# Patient Record
Sex: Female | Born: 1935 | Race: White | Hispanic: No | Marital: Single | Smoking: Former smoker
Health system: Southern US, Community
[De-identification: ages and names within clinical notes are randomized; demographics above are authoritative.]

## PROBLEM LIST (undated history)

## (undated) DIAGNOSIS — F32A Depression, unspecified: Secondary | ICD-10-CM

## (undated) DIAGNOSIS — S72141A Displaced intertrochanteric fracture of right femur, initial encounter for closed fracture: Secondary | ICD-10-CM

## (undated) DIAGNOSIS — I10 Essential (primary) hypertension: Secondary | ICD-10-CM

## (undated) DIAGNOSIS — I679 Cerebrovascular disease, unspecified: Secondary | ICD-10-CM

## (undated) DIAGNOSIS — E0590 Thyrotoxicosis, unspecified without thyrotoxic crisis or storm: Secondary | ICD-10-CM

## (undated) DIAGNOSIS — I442 Atrioventricular block, complete: Secondary | ICD-10-CM

## (undated) DIAGNOSIS — I4729 Other ventricular tachycardia: Secondary | ICD-10-CM

## (undated) DIAGNOSIS — M1990 Unspecified osteoarthritis, unspecified site: Secondary | ICD-10-CM

## (undated) DIAGNOSIS — F329 Major depressive disorder, single episode, unspecified: Secondary | ICD-10-CM

## (undated) DIAGNOSIS — I472 Ventricular tachycardia: Secondary | ICD-10-CM

## (undated) DIAGNOSIS — I739 Peripheral vascular disease, unspecified: Secondary | ICD-10-CM

## (undated) DIAGNOSIS — H3530 Unspecified macular degeneration: Secondary | ICD-10-CM

## (undated) DIAGNOSIS — E785 Hyperlipidemia, unspecified: Secondary | ICD-10-CM

## (undated) HISTORY — PX: OTHER SURGICAL HISTORY: SHX169

---
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
## 2001-04-06 ENCOUNTER — Encounter: Payer: Self-pay | Admitting: Family Medicine

## 2001-04-06 ENCOUNTER — Ambulatory Visit (HOSPITAL_COMMUNITY): Admission: RE | Admit: 2001-04-06 | Discharge: 2001-04-06 | Payer: Self-pay | Admitting: Family Medicine

## 2001-04-13 ENCOUNTER — Encounter: Payer: Self-pay | Admitting: Family Medicine

## 2001-04-13 ENCOUNTER — Ambulatory Visit (HOSPITAL_COMMUNITY): Admission: RE | Admit: 2001-04-13 | Discharge: 2001-04-13 | Payer: Self-pay | Admitting: Family Medicine

## 2001-04-20 ENCOUNTER — Encounter (INDEPENDENT_AMBULATORY_CARE_PROVIDER_SITE_OTHER): Payer: Self-pay | Admitting: *Deleted

## 2001-04-20 ENCOUNTER — Ambulatory Visit (HOSPITAL_COMMUNITY): Admission: RE | Admit: 2001-04-20 | Discharge: 2001-04-20 | Payer: Self-pay | Admitting: Family Medicine

## 2001-04-20 ENCOUNTER — Encounter: Payer: Self-pay | Admitting: Family Medicine

## 2001-05-13 ENCOUNTER — Ambulatory Visit (HOSPITAL_COMMUNITY): Admission: RE | Admit: 2001-05-13 | Discharge: 2001-05-13 | Payer: Self-pay | Admitting: Family Medicine

## 2001-05-13 ENCOUNTER — Encounter (INDEPENDENT_AMBULATORY_CARE_PROVIDER_SITE_OTHER): Payer: Self-pay | Admitting: Specialist

## 2001-05-13 ENCOUNTER — Encounter: Payer: Self-pay | Admitting: Family Medicine

## 2001-06-15 ENCOUNTER — Encounter: Payer: Self-pay | Admitting: Family Medicine

## 2001-06-15 ENCOUNTER — Encounter: Admission: RE | Admit: 2001-06-15 | Discharge: 2001-06-15 | Payer: Self-pay | Admitting: Family Medicine

## 2008-08-31 ENCOUNTER — Encounter: Admission: RE | Admit: 2008-08-31 | Discharge: 2008-08-31 | Payer: Self-pay | Admitting: Family Medicine

## 2008-09-18 ENCOUNTER — Ambulatory Visit: Payer: Self-pay | Admitting: Vascular Surgery

## 2009-04-16 ENCOUNTER — Encounter: Admission: RE | Admit: 2009-04-16 | Discharge: 2009-04-16 | Payer: Self-pay | Admitting: Neurology

## 2010-07-20 ENCOUNTER — Encounter: Payer: Self-pay | Admitting: Family Medicine

## 2010-07-21 ENCOUNTER — Encounter: Payer: Self-pay | Admitting: Internal Medicine

## 2010-11-11 NOTE — H&P (Signed)
HISTORY AND PHYSICAL EXAMINATION   September 18, 2008   Re:  Kathryn [name], ACRE                  DOB:  Jun 14, 1923   DATE OF ADMISSION:  To be determined.   CHIEF COMPLAINT:  Severe left internal carotid stenosis - asymptomatic.   HISTORY OF PRESENT ILLNESS:  This 77 year old female was found by Dr.  Nicholos Johns to have a left carotid bruit.  She had a history of hypertension  but no history of previous stroke, definite hemispheric TIA, amaurosis  fugax, diplopia, blurred vision, or syncope.  Carotid duplex exam was  performed at [facility] on August 31, 2008, which revealed an  80% left internal carotid stenosis and a proximal 60-70% right internal  carotid stenosis.  She was referred for further evaluation and possible  surgery.   PAST MEDICAL HISTORY:  1. Hypertension.  2. Hyperlipidemia.  3. Negative for diabetes mellitus, coronary artery disease, COPD, or      stroke.  She does have a history of cardiac arrhythmias in the      past.   PREVIOUS SURGERIES:  1. Removal of a vocal cord nodule.  2. Appendectomy.   FAMILY HISTORY:  Positive for coronary artery disease.  Father died of a  myocardial infarction.  Positive for diabetes in a maternal aunt.  Negative for stroke.   SOCIAL HISTORY:  She is single and has 1 child, is retired.  She smokes  a pack of cigarettes per day and has done so for 60+ years.  She does  not use alcohol.   REVIEW OF SYSTEMS:  Negative for chest pain, dyspnea on exertion, PND,  orthopnea.  Has had some weight loss over the last 18 months due to  slight decrease in appetite.  No GI or GU symptoms.  Has lower extremity  discomfort while walking.  Significant arthritis.   ALLERGIES:  None known.   MEDICATIONS:  1. Lisinopril/hydrochlorothiazide 20/25 mg 1 daily.  2. Simvastatin 40 mg daily.  3. Sertraline hydrochloride 50 mg 1/2 tablet daily.   PHYSICAL EXAM:  Blood pressure 165/73, heart rate 75, respirations 14.  Generally, she is an elderly spry female who is in no apparent distress,  alert and oriented x3.  Neck is supple, 3+ carotid pulses palpable.  There is a high-pitched bruit over the left carotid bifurcation.  No  palpable adenopathy in the neck.  Chest is clear to auscultation.  Neurologic exam is normal.  Cardiovascular exam reveals a regular  rhythm, no murmurs.  Her abdomen is soft and nontender with no masses.  She has 3+ femoral, popliteal, and 1+ dorsalis pedis pulses bilaterally.  There is no distal edema.   I reviewed the velocities in the report of the carotid duplex, and agree  that she does have at least an 80% left internal carotid stenosis, which  is asymptomatic.   IMPRESSION:  Severe left internal carotid stenosis - asymptomatic.   PLAN:  I have recommended left carotid endarterectomy for this  asymptomatic stenosis.  Risks and benefits have been thoroughly  discussed with her and her grand daughters, and they will discuss this  and let us know when she would like to proceed.   Quita Skye Hart Rochester, M.D.  Electronically Signed   JDL/MEDQ  D:  09/18/2008  T:  09/19/2008  Job:  [id number]   cc:   Bryan Lemma. Manus Gunning, M.D.  Robert A. Nicholos Johns, M.D.

## 2011-09-22 DIAGNOSIS — S72141A Displaced intertrochanteric fracture of right femur, initial encounter for closed fracture: Secondary | ICD-10-CM

## 2011-09-22 HISTORY — DX: Displaced intertrochanteric fracture of right femur, initial encounter for closed fracture: S72.141A

## 2011-09-23 ENCOUNTER — Other Ambulatory Visit: Payer: Self-pay

## 2011-09-23 ENCOUNTER — Encounter (HOSPITAL_COMMUNITY): Payer: Self-pay | Admitting: Emergency Medicine

## 2011-09-23 ENCOUNTER — Emergency Department (HOSPITAL_COMMUNITY): Payer: Medicare Other

## 2011-09-23 ENCOUNTER — Inpatient Hospital Stay (HOSPITAL_COMMUNITY)
Admission: EM | Admit: 2011-09-23 | Discharge: 2011-09-28 | DRG: 481 | Disposition: A | Discharge status: Skilled Nursing Facility | Payer: Medicare Other | Attending: Internal Medicine | Admitting: Internal Medicine

## 2011-09-23 DIAGNOSIS — I379 Nonrheumatic pulmonary valve disorder, unspecified: Secondary | ICD-10-CM | POA: Diagnosis present

## 2011-09-23 DIAGNOSIS — Z720 Tobacco use: Secondary | ICD-10-CM | POA: Diagnosis present

## 2011-09-23 DIAGNOSIS — Y998 Other external cause status: Secondary | ICD-10-CM

## 2011-09-23 DIAGNOSIS — I442 Atrioventricular block, complete: Secondary | ICD-10-CM

## 2011-09-23 DIAGNOSIS — I079 Rheumatic tricuspid valve disease, unspecified: Secondary | ICD-10-CM | POA: Diagnosis present

## 2011-09-23 DIAGNOSIS — I10 Essential (primary) hypertension: Secondary | ICD-10-CM | POA: Diagnosis present

## 2011-09-23 DIAGNOSIS — H3530 Unspecified macular degeneration: Secondary | ICD-10-CM | POA: Diagnosis present

## 2011-09-23 DIAGNOSIS — S72009A Fracture of unspecified part of neck of unspecified femur, initial encounter for closed fracture: Secondary | ICD-10-CM

## 2011-09-23 DIAGNOSIS — I2789 Other specified pulmonary heart diseases: Secondary | ICD-10-CM | POA: Diagnosis present

## 2011-09-23 DIAGNOSIS — I498 Other specified cardiac arrhythmias: Secondary | ICD-10-CM | POA: Diagnosis present

## 2011-09-23 DIAGNOSIS — I472 Ventricular tachycardia: Secondary | ICD-10-CM

## 2011-09-23 DIAGNOSIS — Z8674 Personal history of sudden cardiac arrest: Secondary | ICD-10-CM

## 2011-09-23 DIAGNOSIS — S72141A Displaced intertrochanteric fracture of right femur, initial encounter for closed fracture: Secondary | ICD-10-CM | POA: Diagnosis present

## 2011-09-23 DIAGNOSIS — A498 Other bacterial infections of unspecified site: Secondary | ICD-10-CM | POA: Diagnosis present

## 2011-09-23 DIAGNOSIS — H548 Legal blindness, as defined in USA: Secondary | ICD-10-CM | POA: Diagnosis present

## 2011-09-23 DIAGNOSIS — I679 Cerebrovascular disease, unspecified: Secondary | ICD-10-CM

## 2011-09-23 DIAGNOSIS — Z7982 Long term (current) use of aspirin: Secondary | ICD-10-CM

## 2011-09-23 DIAGNOSIS — E785 Hyperlipidemia, unspecified: Secondary | ICD-10-CM | POA: Diagnosis present

## 2011-09-23 DIAGNOSIS — I459 Conduction disorder, unspecified: Secondary | ICD-10-CM | POA: Diagnosis present

## 2011-09-23 DIAGNOSIS — N390 Urinary tract infection, site not specified: Secondary | ICD-10-CM | POA: Diagnosis present

## 2011-09-23 DIAGNOSIS — S72143A Displaced intertrochanteric fracture of unspecified femur, initial encounter for closed fracture: Principal | ICD-10-CM | POA: Diagnosis present

## 2011-09-23 DIAGNOSIS — I739 Peripheral vascular disease, unspecified: Secondary | ICD-10-CM | POA: Insufficient documentation

## 2011-09-23 DIAGNOSIS — Z66 Do not resuscitate: Secondary | ICD-10-CM | POA: Diagnosis present

## 2011-09-23 DIAGNOSIS — Z87891 Personal history of nicotine dependence: Secondary | ICD-10-CM

## 2011-09-23 DIAGNOSIS — I6529 Occlusion and stenosis of unspecified carotid artery: Secondary | ICD-10-CM | POA: Diagnosis present

## 2011-09-23 DIAGNOSIS — E0590 Thyrotoxicosis, unspecified without thyrotoxic crisis or storm: Secondary | ICD-10-CM | POA: Diagnosis present

## 2011-09-23 DIAGNOSIS — W108XXA Fall (on) (from) other stairs and steps, initial encounter: Secondary | ICD-10-CM | POA: Diagnosis present

## 2011-09-23 HISTORY — DX: Unspecified osteoarthritis, unspecified site: M19.90

## 2011-09-23 HISTORY — DX: Major depressive disorder, single episode, unspecified: F32.9

## 2011-09-23 HISTORY — DX: Ventricular tachycardia: I47.2

## 2011-09-23 HISTORY — DX: Thyrotoxicosis, unspecified without thyrotoxic crisis or storm: E05.90

## 2011-09-23 HISTORY — DX: Peripheral vascular disease, unspecified: I73.9

## 2011-09-23 HISTORY — DX: Essential (primary) hypertension: I10

## 2011-09-23 HISTORY — DX: Other ventricular tachycardia: I47.29

## 2011-09-23 HISTORY — DX: Atrioventricular block, complete: I44.2

## 2011-09-23 HISTORY — DX: Displaced intertrochanteric fracture of right femur, initial encounter for closed fracture: S72.141A

## 2011-09-23 HISTORY — DX: Cerebrovascular disease, unspecified: I67.9

## 2011-09-23 HISTORY — DX: Unspecified macular degeneration: H35.30

## 2011-09-23 HISTORY — DX: Hyperlipidemia, unspecified: E78.5

## 2011-09-23 HISTORY — DX: Depression, unspecified: F32.A

## 2011-09-23 LAB — CBC
Hemoglobin: 11.8 g/dL — ABNORMAL LOW (ref 12.0–15.0)
MCHC: 34.7 g/dL (ref 30.0–36.0)
RBC: 3.79 MIL/uL — ABNORMAL LOW (ref 3.87–5.11)
WBC: 15.3 10*3/uL — ABNORMAL HIGH (ref 4.0–10.5)

## 2011-09-23 LAB — GLUCOSE, CAPILLARY: Glucose-Capillary: 143 mg/dL — ABNORMAL HIGH (ref 70–99)

## 2011-09-23 LAB — MRSA PCR SCREENING: MRSA by PCR: NEGATIVE

## 2011-09-23 LAB — URINALYSIS, ROUTINE W REFLEX MICROSCOPIC
Leukocytes, UA: NEGATIVE
Nitrite: NEGATIVE
Specific Gravity, Urine: 1.013 (ref 1.005–1.030)
pH: 6.5 (ref 5.0–8.0)

## 2011-09-23 LAB — DIFFERENTIAL
Basophils Relative: 0 % (ref 0–1)
Lymphocytes Relative: 3 % — ABNORMAL LOW (ref 12–46)
Monocytes Relative: 5 % (ref 3–12)
Neutro Abs: 14 10*3/uL — ABNORMAL HIGH (ref 1.7–7.7)
Neutrophils Relative %: 92 % — ABNORMAL HIGH (ref 43–77)

## 2011-09-23 LAB — BASIC METABOLIC PANEL
BUN: 16 mg/dL (ref 6–23)
Chloride: 95 mEq/L — ABNORMAL LOW (ref 96–112)
GFR calc Af Amer: >90 mL/min (ref >90)
Potassium: 4 mEq/L (ref 3.5–5.1)

## 2011-09-23 LAB — CARDIAC PANEL(CRET KIN+CKTOT+MB+TROPI)
CK, MB: 3.7 ng/mL (ref 0.3–4.0)
Troponin I: <0.3 ng/mL (ref <0.30)

## 2011-09-23 LAB — MAGNESIUM: Magnesium: 1.6 mg/dL (ref 1.5–2.5)

## 2011-09-23 LAB — TROPONIN I: Troponin I: <0.3 ng/mL (ref <0.30)

## 2011-09-23 MED ORDER — CALCIUM CARBONATE-VITAMIN D 500-200 MG-UNIT PO TABS
1.0000 | ORAL_TABLET | Freq: Every day | ORAL | Status: DC
  Administered 2011-09-23 – 2011-09-28 (×5): 1 via ORAL
  Filled 2011-09-23 (×6): qty 1

## 2011-09-23 MED ORDER — MORPHINE SULFATE 2 MG/ML IJ SOLN
2.0000 mg | Freq: Once | INTRAMUSCULAR | Status: AC
  Administered 2011-09-23: 2 mg via INTRAVENOUS
  Filled 2011-09-23: qty 1

## 2011-09-23 MED ORDER — PROSIGHT PO TABS
1.0000 | ORAL_TABLET | Freq: Every day | ORAL | Status: DC
  Administered 2011-09-23: 1 via ORAL
  Filled 2011-09-23 (×2): qty 1

## 2011-09-23 MED ORDER — OCUVITE PRESERVISION PO TABS: 1.0000 | ORAL_TABLET | Freq: Every day | ORAL | Status: DC

## 2011-09-23 MED ORDER — MORPHINE SULFATE 4 MG/ML IJ SOLN: 2.0000 mg | INTRAMUSCULAR | Status: DC | PRN

## 2011-09-23 MED ORDER — OMEGA-3 FATTY ACIDS 1000 MG PO CAPS: 1.0000 g | ORAL_CAPSULE | Freq: Every day | ORAL | Status: DC

## 2011-09-23 MED ORDER — OCUVITE-LUTEIN PO CAPS
1.0000 | ORAL_CAPSULE | Freq: Every day | ORAL | Status: DC
  Filled 2011-09-23: qty 1

## 2011-09-23 MED ORDER — OMEGA-3-ACID ETHYL ESTERS 1 G PO CAPS
1.0000 g | ORAL_CAPSULE | Freq: Two times a day (BID) | ORAL | Status: DC
  Administered 2011-09-23: 1 g via ORAL
  Filled 2011-09-23 (×3): qty 1

## 2011-09-23 MED ORDER — MORPHINE SULFATE 2 MG/ML IJ SOLN
1.0000 mg | INTRAMUSCULAR | Status: DC | PRN
  Administered 2011-09-24: 1 mg via INTRAVENOUS
  Filled 2011-09-23: qty 1

## 2011-09-23 MED ORDER — ASPIRIN EC 325 MG PO TBEC
325.0000 mg | DELAYED_RELEASE_TABLET | Freq: Every day | ORAL | Status: DC
  Administered 2011-09-24 – 2011-09-28 (×5): 325 mg via ORAL
  Filled 2011-09-23 (×6): qty 1

## 2011-09-23 MED ORDER — SODIUM CHLORIDE 0.9 % IV SOLN
INTRAVENOUS | Status: DC
  Administered 2011-09-23: 22:00:00 via INTRAVENOUS

## 2011-09-23 MED ORDER — MAGNESIUM SULFATE 40 MG/ML IJ SOLN
2.0000 g | Freq: Once | INTRAMUSCULAR | Status: AC
  Administered 2011-09-23: 2 g via INTRAVENOUS
  Filled 2011-09-23: qty 50

## 2011-09-23 MED ORDER — LISINOPRIL 40 MG PO TABS
40.0000 mg | ORAL_TABLET | Freq: Every day | ORAL | Status: DC
  Administered 2011-09-23 – 2011-09-28 (×6): 40 mg via ORAL
  Filled 2011-09-23 (×6): qty 1

## 2011-09-23 MED ORDER — CALCIUM-VITAMIN D 600-200 MG-UNIT PO TABS: 1.0000 | ORAL_TABLET | Freq: Every day | ORAL | Status: DC

## 2011-09-23 MED ORDER — ENOXAPARIN SODIUM 40 MG/0.4ML ~~LOC~~ SOLN
40.0000 mg | SUBCUTANEOUS | Status: DC
  Administered 2011-09-23: 40 mg via SUBCUTANEOUS
  Filled 2011-09-23 (×2): qty 0.4

## 2011-09-23 MED ORDER — CIPROFLOXACIN IN D5W 400 MG/200ML IV SOLN
400.0000 mg | INTRAVENOUS | Status: DC
  Administered 2011-09-24: 400 mg via INTRAVENOUS
  Filled 2011-09-23 (×2): qty 200

## 2011-09-23 MED ORDER — SERTRALINE HCL 50 MG PO TABS
50.0000 mg | ORAL_TABLET | Freq: Every day | ORAL | Status: DC
  Administered 2011-09-23 – 2011-09-28 (×6): 50 mg via ORAL
  Filled 2011-09-23 (×6): qty 1

## 2011-09-23 MED ORDER — AMIODARONE HCL IN DEXTROSE 360-4.14 MG/200ML-% IV SOLN
INTRAVENOUS | Status: AC
  Filled 2011-09-23: qty 200

## 2011-09-23 MED ORDER — HYDRALAZINE HCL 25 MG PO TABS
25.0000 mg | ORAL_TABLET | Freq: Three times a day (TID) | ORAL | Status: DC
  Administered 2011-09-23 – 2011-09-28 (×13): 25 mg via ORAL
  Filled 2011-09-23 (×17): qty 1

## 2011-09-23 MED ORDER — SIMVASTATIN 20 MG PO TABS
20.0000 mg | ORAL_TABLET | Freq: Every evening | ORAL | Status: DC
  Administered 2011-09-23 – 2011-09-28 (×6): 20 mg via ORAL
  Filled 2011-09-23 (×6): qty 1

## 2011-09-23 MED ORDER — METHIMAZOLE 5 MG PO TABS
5.0000 mg | ORAL_TABLET | Freq: Every day | ORAL | Status: DC
  Administered 2011-09-23 – 2011-09-28 (×6): 5 mg via ORAL
  Filled 2011-09-23 (×8): qty 1

## 2011-09-23 MED ORDER — MORPHINE SULFATE 4 MG/ML IJ SOLN: 4.0000 mg | Freq: Once | INTRAMUSCULAR | Status: DC

## 2011-09-23 MED ORDER — HYDROCODONE-ACETAMINOPHEN 5-325 MG PO TABS: 1.0000 | ORAL_TABLET | ORAL | Status: DC | PRN

## 2011-09-23 MED ORDER — VITAMIN B-6 100 MG PO TABS
100.0000 mg | ORAL_TABLET | Freq: Every day | ORAL | Status: DC
  Administered 2011-09-23 – 2011-09-28 (×5): 100 mg via ORAL
  Filled 2011-09-23 (×6): qty 1

## 2011-09-23 NOTE — ED Notes (Signed)
Assumed patient's care, pt is alert, awake, answers questions appropriately. Skin is warm and dry, respiration is even and unlabored. Daughter is at the bedside.

## 2011-09-23 NOTE — ED Notes (Signed)
Pt was found on floor by family this morning around 1015. Pt reports she lost her footing while walking up steps and fell onto steps last night. Pt was laying on floor since fall until found by family. Pt AAOx3.

## 2011-09-23 NOTE — Consult Note (Signed)
Reason for Consult:R hip pain  Referring Physician: EDP and hospitalist  HPI: Kathryn [name] is an 78 y.o. female found in her home by neighbor after falling last night and injuring right hip with inability to bear weight. She lay on floor overnight until neighbor noticed her light on overnight and checked on her this am and notified EMS.  As I entered room to evaluate pt she had just had episode of asystole and new EKG showing heart block 1st degree. Past Medical History  Diagnosis Date  . Hypertension   . High cholesterol   . Hyperthyroidism   . Arthritis   . Depression   . Broken hip 09/22/11    right; S/P fall  . Dysrhythmia     bradycardia    Past Surgical History  Procedure Date  . Appendectomy 1943  . Laryngeal nodule ~ 1960's    removed    History reviewed. No pertinent family history.  Social History:  reports that she quit smoking about 3 months ago. Her smoking use included Cigarettes. She has a 66 pack-year smoking history. She has never used smokeless tobacco. She reports that she does not drink alcohol or use illicit drugs.  Allergies: No Known Allergies  Medications: I have reviewed the patient's current medications.  Results for orders placed during the hospital encounter of 09/23/11 (from the past 48 hour(s))  URINALYSIS, ROUTINE W REFLEX MICROSCOPIC     Status: Abnormal   Collection Time   09/23/11 12:42 PM      Component Value Range Comment   Color, Urine YELLOW  YELLOW     APPearance CLOUDY (*) CLEAR     Specific Gravity, Urine 1.013  1.005 - 1.030     pH 6.5  5.0 - 8.0     Glucose, UA NEGATIVE  NEGATIVE (mg/dL)    Hgb urine dipstick MODERATE (*) NEGATIVE     Bilirubin Urine NEGATIVE  NEGATIVE     Ketones, ur NEGATIVE  NEGATIVE (mg/dL)    Protein, ur >782 (*) NEGATIVE (mg/dL)    Urobilinogen, UA 1.0  0.0 - 1.0 (mg/dL)    Nitrite NEGATIVE  NEGATIVE     Leukocytes, UA NEGATIVE  NEGATIVE    URINE MICROSCOPIC-ADD ON     Status: Abnormal   Collection Time   09/23/11 12:42 PM      Component Value Range Comment   Squamous Epithelial / LPF RARE  RARE     WBC, UA 0-2  <3 (WBC/hpf)    RBC / HPF 7-10  <3 (RBC/hpf)    Bacteria, UA MANY (*) RARE  CHECKED  CARDIAC PANEL(CRET KIN+CKTOT+MB+TROPI)     Status: Normal   Collection Time   09/23/11  1:00 PM      Component Value Range Comment   Total CK 64  7 - 177 (U/L)    CK, MB 3.7  0.3 - 4.0 (ng/mL)    Troponin I <0.30  <0.30 (ng/mL)    Relative Index RELATIVE INDEX IS INVALID  0.0 - 2.5    CBC     Status: Abnormal   Collection Time   09/23/11  1:10 PM      Component Value Range Comment   WBC 15.3 (*) 4.0 - 10.5 (K/uL)    RBC 3.79 (*) 3.87 - 5.11 (MIL/uL)    Hemoglobin 11.8 (*) 12.0 - 15.0 (g/dL)    HCT 95.6 (*) 21.3 - 46.0 (%)    MCV 89.7  78.0 - 100.0 (fL)    MCH 31.1  26.0 - 34.0 (pg)    MCHC 34.7  30.0 - 36.0 (g/dL)    RDW 16.1 (*) 09.6 - 15.5 (%)    Platelets 185  150 - 400 (K/uL)   DIFFERENTIAL     Status: Abnormal   Collection Time   09/23/11  1:10 PM      Component Value Range Comment   Neutrophils Relative 92 (*) 43 - 77 (%)    Neutro Abs 14.0 (*) 1.7 - 7.7 (K/uL)    Lymphocytes Relative 3 (*) 12 - 46 (%)    Lymphs Abs 0.5 (*) 0.7 - 4.0 (K/uL)    Monocytes Relative 5  3 - 12 (%)    Monocytes Absolute 0.8  0.1 - 1.0 (K/uL)    Eosinophils Relative 0  0 - 5 (%)    Eosinophils Absolute 0.0  0.0 - 0.7 (K/uL)    Basophils Relative 0  0 - 1 (%)    Basophils Absolute 0.0  0.0 - 0.1 (K/uL)   BASIC METABOLIC PANEL     Status: Abnormal   Collection Time   09/23/11  1:10 PM      Component Value Range Comment   Sodium 130 (*) 135 - 145 (mEq/L)    Potassium 4.0  3.5 - 5.1 (mEq/L)    Chloride 95 (*) 96 - 112 (mEq/L)    CO2 24  19 - 32 (mEq/L)    Glucose, Bld 188 (*) 70 - 99 (mg/dL)    BUN 16  6 - 23 (mg/dL)    Creatinine, Ser 0.45  0.50 - 1.10 (mg/dL)    Calcium 8.8  8.4 - 10.5 (mg/dL)    GFR calc non Af Amer 85 (*) >90 (mL/min)    GFR calc Af Amer >90  >90 (mL/min)      Dg Chest 1 View  09/23/2011  *RADIOLOGY REPORT*  Clinical Data: Right femoral neck fracture.  Preoperative respiratory evaluation.  CHEST - 1 VIEW 09/23/2011:  Comparison: None.  Findings: Cardiac silhouette moderately enlarged.  Prominent paracardiac fat pad on the left.  Thoracic aorta atherosclerotic. Hilar and mediastinal contours otherwise unremarkable.  Prominent bronchovascular markings diffusely and moderate central peribronchial thickening.  No localized airspace consolidation.  No evidence of interstitial pulmonary edema.  No visible pleural effusions.  Multiple left rib fractures, generalized osteopenia, and degenerative changes in the shoulder joints.  IMPRESSION: Moderate cardiomegaly without pulmonary edema.  Moderate changes of bronchitis and/or asthma, likely chronic.  No convincing evidence of acute cardiopulmonary disease.  Original Report Authenticated By: Arnell Sieving, M.D.   Dg Hip Complete Right  09/23/2011  *RADIOLOGY REPORT*  Clinical Data: Fall with right hip pain.  RIGHT HIP - COMPLETE 2+ VIEW  Comparison: None.  Findings: There is an intertrochanteric fracture of the right femur with varus angulation of the fracture fragments.  Mild degenerative changes in both hips.  No dislocation.  Osteopenia.  Degenerative changes in the visualized portion of the spine.  Vascular calcifications.  IMPRESSION:  1.  Intertrochanteric right femur fracture. Per CMS PQRS reporting requirements (PQRS Measure 24): Given the patient's age of greater than 50 and the fracture site (hip, distal radius, or spine), the patient should be tested for osteoporosis using DXA, and the appropriate treatment considered based on the DXA results. 2.  Osteopenia. 3.  Spondylosis.  Original Report Authenticated By: Reyes Ivan, M.D.   Ct Head Wo Contrast  09/23/2011  *RADIOLOGY REPORT*  Clinical Data:  Larey Seat.  Found down.  CT HEAD WITHOUT  CONTRAST CT CERVICAL SPINE WITHOUT CONTRAST  Technique:   Multidetector CT imaging of the head and cervical spine was performed following the standard protocol without intravenous contrast.  Multiplanar CT image reconstructions of the cervical spine were also generated.  Comparison:  None  CT HEAD  Findings: Age related cerebral atrophy, ventriculomegaly and periventricular white matter disease.  Remote appearing lacunar type infarct in the right basal ganglia region and mild ex vacuo dilatation of the frontal horn the right lateral ventricle.  No CT findings for acute hemispheric infarction and/or intracranial hemorrhage.  No extra-axial fluid collections are identified.  The brainstem and cerebellum grossly normal.  The bony structures are intact.  No skull fracture.  The paranasal sinuses and mastoid air cells are clear except for minimal debris in the right frontal sinus.  Osteoporotic changes involving the skull.  IMPRESSION:  1.  Age related cerebral atrophy, ventriculomegaly and periventricular white matter disease. 2.  Remote lacunar type basal ganglia infarcts. 3.  No skull fracture or subdural hematoma.  CT CERVICAL SPINE  Findings: Advanced degenerative cervical spondylosis with disc disease and facet disease.  The overall alignment is maintained. Mild multilevel degenerative subluxations.  The facets are normally aligned.  Advanced facet degenerative changes.  The skull base C1 and C1-2 articulations are maintained.  The dens is intact.  The lung apices are clear.  IMPRESSION:  1.  Advanced degenerative cervical spondylosis with disc disease and facet disease. 2.  No acute cervical spine fracture.  Original Report Authenticated By: P. Loralie Champagne, M.D.   Ct Cervical Spine Wo Contrast  09/23/2011  *RADIOLOGY REPORT*  Clinical Data:  Larey Seat.  Found down.  CT HEAD WITHOUT CONTRAST CT CERVICAL SPINE WITHOUT CONTRAST  Technique:  Multidetector CT imaging of the head and cervical spine was performed following the standard protocol without intravenous contrast.   Multiplanar CT image reconstructions of the cervical spine were also generated.  Comparison:  None  CT HEAD  Findings: Age related cerebral atrophy, ventriculomegaly and periventricular white matter disease.  Remote appearing lacunar type infarct in the right basal ganglia region and mild ex vacuo dilatation of the frontal horn the right lateral ventricle.  No CT findings for acute hemispheric infarction and/or intracranial hemorrhage.  No extra-axial fluid collections are identified.  The brainstem and cerebellum grossly normal.  The bony structures are intact.  No skull fracture.  The paranasal sinuses and mastoid air cells are clear except for minimal debris in the right frontal sinus.  Osteoporotic changes involving the skull.  IMPRESSION:  1.  Age related cerebral atrophy, ventriculomegaly and periventricular white matter disease. 2.  Remote lacunar type basal ganglia infarcts. 3.  No skull fracture or subdural hematoma.  CT CERVICAL SPINE  Findings: Advanced degenerative cervical spondylosis with disc disease and facet disease.  The overall alignment is maintained. Mild multilevel degenerative subluxations.  The facets are normally aligned.  Advanced facet degenerative changes.  The skull base C1 and C1-2 articulations are maintained.  The dens is intact.  The lung apices are clear.  IMPRESSION:  1.  Advanced degenerative cervical spondylosis with disc disease and facet disease. 2.  No acute cervical spine fracture.  Original Report Authenticated By: P. Loralie Champagne, M.D.    EAV:[id number] states she was in usual state of health.  Physical Exam: Extremities: Right LE shortened and ER. NVI Vitals Temp:  [97.5 F (36.4 C)-97.6 F (36.4 C)] 97.6 F (36.4 C) (03/27 1808) Pulse Rate:  [42-48] 45  (03/27 1808) Resp:  [  16-19] 17  (03/27 1808) BP: (182-235)/(52-57) 183/57 mmHg (03/27 1808) SpO2:  [97 %-100 %] 99 % (03/27 1808)  Assessment/Plan: Impression:Right Intertochanteric Hip fracture. New  DX 1st degree Heart block Plan:will need ORIF of Right hip however her current cardiac situation takes precedence.  [facility] for Dr. Francena Hanly 09/23/2011, 6:39 PM

## 2011-09-23 NOTE — Progress Notes (Signed)
Received report from ED nurse with patient code status as DNR.While reviewing admission health history, patient grand daughter informed me that the patient is supposed to be full code not DNR.Maren Reamer NP was made aware of the situation and also informed of patient blood pressure and HR.Recieved order to transfer patient to CCU.

## 2011-09-23 NOTE — ED Notes (Signed)
Spoke to Dr. Gonzella Lex and was informed about patient's condition, said that he will come and see the patient.

## 2011-09-23 NOTE — ED Notes (Addendum)
Pt came to the ED because she fell last night at home. She was found by family this morning. She was complaining of right hip pain. Per previous RN, pt also stop breathing and did not have a pulse earlier. Currently, breathing and in 3 degree heartblock. MD aware.

## 2011-09-23 NOTE — ED Notes (Signed)
Dr. Gonzella Lex was in to see the patient

## 2011-09-23 NOTE — Progress Notes (Signed)
Patient ID: Kathryn [name], female   DOB: 1922-07-10, 78 y.o.   MRN: [id number]   Pt was admitted earlier today with heart block and syncope spell resulting in right femur fx. She coded in the ED today. There was some descrepancy in her code status. Cardio verified FULL CODE status as did this NP via conversation with the grand daughter tonight. Pt wishes are FULL CODE but not to live on life support. Because of this, and after review of today's notes, pt is better suited for 2900 ICU status. This NP spoke to Dr. Herma Carson of PCCM who agrees with this decision. Pt will be transferred to 2900 tonight. Will remain under [facility] care with cardiology consulting for now. Should status change, can call PCCM back to consult or assume care.   Maren Reamer, NP [facility]

## 2011-09-23 NOTE — H&P (Addendum)
PCP: Tally Joe     DOA:  09/23/2011 11:52 AM  Chief Complaint:  Fall at home  HPI: 78 y/o female with hx of HTN, HL, hyperthyroidism, ex smoker, hx of left ICA stenosis brought in by EMS after sustaining a fall at home last evening. She is legally blind due to macular degeneration  but is able to ambulate in and around the house. She informs tripping on the stairs while going to her room. She denies any dizziness, headache, chest pain , palpitation or SOB associated with the fall . Denies N/V abdominal pain or diarrhea, denies chills  Or fever. She was unable to get up and was on the floor overnight and also was unable to hold her bowel and urine as she could not get up to use the bathroom. In the ED she was noted to have intertrochanteric fracture of rt femur. Patient at baseline is able to ambulate in and around the house,. She is able to see objects at close distance with her  glasses only.   Allergies: No Known Allergies  Prior to Admission medications   Medication Sig Start Date End Date Taking? Authorizing Provider  amLODipine (NORVASC) 2.5 MG tablet Take 2.5 mg by mouth daily.   Yes Historical Provider, MD  aspirin EC 325 MG tablet Take 325 mg by mouth daily.   Yes Historical Provider, MD  Calcium-Vitamin D 600-200 MG-UNIT per tablet Take 1 tablet by mouth daily.   Yes Historical Provider, MD  fish oil-omega-3 fatty acids 1000 MG capsule Take 1 g by mouth daily.   Yes Historical Provider, MD  lisinopril (PRINIVIL,ZESTRIL) 20 MG tablet Take 40 mg by mouth daily.   Yes Historical Provider, MD  methimazole (TAPAZOLE) 5 MG tablet Take 5 mg by mouth daily.   Yes Historical Provider, MD  Multiple Vitamins-Minerals (OCUVITE PRESERVISION PO) Take 1 tablet by mouth daily.   Yes Historical Provider, MD  pyridOXINE (VITAMIN B-6) 100 MG tablet Take 100 mg by mouth daily.   Yes Historical Provider, MD  sertraline (ZOLOFT) 50 MG tablet Take 50 mg by mouth daily.   Yes Historical Provider, MD    simvastatin (ZOCOR) 20 MG tablet Take 20 mg by mouth every evening.   Yes Historical Provider, MD    Past Medical History  Diagnosis Date  . Hypertension   . High cholesterol   . Hyperthyroidism   . Arthritis   . Depression   . Broken hip 09/22/11    right; S/P fall  . Dysrhythmia     bradycardia    Past Surgical History  Procedure Date  . Appendectomy 1943  . Laryngeal nodule ~ 1960's    removed    Social History:  reports that she quit smoking about 3 months ago. Her smoking use included Cigarettes. She has a 66 pack-year smoking history. She has never used smokeless tobacco. She reports that she does not drink alcohol or use illicit drugs.  History reviewed. No pertinent family history.  Review of Systems:  Constitutional: Denies fever, chills, diaphoresis, appetite change and fatigue.  HEENT: Denies photophobia, eye pain, redness, hearing loss, ear pain, congestion, sore throat, rhinorrhea, sneezing, mouth sores, trouble swallowing, neck pain, neck stiffness and tinnitus.   Respiratory: Denies SOB, DOE, cough, chest tightness,  and wheezing.   Cardiovascular: Denies chest pain, palpitations and leg swelling.  Gastrointestinal: Denies nausea, vomiting, abdominal pain, diarrhea, constipation, blood in stool and abdominal distention.  Genitourinary: Denies dysuria, urgency, frequency, hematuria, flank pain and difficulty urinating.  Musculoskeletal: severe pain over right hip, back pain, joint swelling, arthralgias and gait problem.  Skin: Denies pallor, rash and wound.  Neurological: Denies dizziness, seizures, syncope, weakness, light-headedness, numbness and headaches.  Hematological: Denies adenopathy. Easy bruising, personal or family bleeding history  Psychiatric/Behavioral: Denies suicidal ideation, mood changes, confusion, nervousness, sleep disturbance and agitation   Physical Exam:  Filed Vitals:   09/23/11 1153 09/23/11 1355 09/23/11 1641  BP: 235/55 197/54  182/54  Pulse: 42  47  Temp: 97.5 F (36.4 C)  97.6 F (36.4 C)  TempSrc: Oral  Oral  Resp: 18  19  SpO2: 97% 100% 100%    Constitutional: Vital signs reviewed.  Patient is an elderly female in no acute distress and cooperative with exam. Alert and oriented x3. Head: Normocephalic and atraumatic Ear: TM normal bilaterally Mouth: no erythema or exudates, MMM Eyes: PERRL, EOMI, conjunctivae normal, No scleral icterus.  Neck: Supple, Trachea midline normal ROM, No JVD, mass, thyromegaly, or carotid bruit present.  Cardiovascular: RRR, S1 normal, S2 normal, no MRG, pulses symmetric and intact bilaterally Pulmonary/Chest: CTAB, no wheezes, rales, or rhonchi Abdominal: Soft. Non-tender, non-distended, bowel sounds are normal, no masses, organomegaly, or guarding present.  GU: no CVA tenderness Musculoskeletal:rt leg externally rotated, painful on movement and ROM, No joint deformities, erythema, or stiffness,  Ext: no edema and no cyanosis, pulses palpable bilaterally (DP and PT) Hematology: no cervical, inginal, or axillary adenopathy.  Neurological: A&O x3, Strenght is normal and symmetric bilaterally, cranial nerve II-XII are grossly intact, no focal motor deficit, sensory intact to light touch bilaterally.  Skin: Warm, dry and intact. No rash, cyanosis, or clubbing.  Psychiatric: Normal mood and affect. speech and behavior is normal. Judgment and thought content normal. Cognition and memory are normal.   Labs on Admission:  Results for orders placed during the hospital encounter of 09/23/11 (from the past 48 hour(s))  URINALYSIS, ROUTINE W REFLEX MICROSCOPIC     Status: Abnormal   Collection Time   09/23/11 12:42 PM      Component Value Range Comment   Color, Urine YELLOW  YELLOW     APPearance CLOUDY (*) CLEAR     Specific Gravity, Urine 1.013  1.005 - 1.030     pH 6.5  5.0 - 8.0     Glucose, UA NEGATIVE  NEGATIVE (mg/dL)    Hgb urine dipstick MODERATE (*) NEGATIVE     Bilirubin  Urine NEGATIVE  NEGATIVE     Ketones, ur NEGATIVE  NEGATIVE (mg/dL)    Protein, ur >161 (*) NEGATIVE (mg/dL)    Urobilinogen, UA 1.0  0.0 - 1.0 (mg/dL)    Nitrite NEGATIVE  NEGATIVE     Leukocytes, UA NEGATIVE  NEGATIVE    URINE MICROSCOPIC-ADD ON     Status: Abnormal   Collection Time   09/23/11 12:42 PM      Component Value Range Comment   Squamous Epithelial / LPF RARE  RARE     WBC, UA 0-2  <3 (WBC/hpf)    RBC / HPF 7-10  <3 (RBC/hpf)    Bacteria, UA MANY (*) RARE  CHECKED  CARDIAC PANEL(CRET KIN+CKTOT+MB+TROPI)     Status: Normal   Collection Time   09/23/11  1:00 PM      Component Value Range Comment   Total CK 64  7 - 177 (U/L)    CK, MB 3.7  0.3 - 4.0 (ng/mL)    Troponin I <0.30  <0.30 (ng/mL)    Relative Index RELATIVE  INDEX IS INVALID  0.0 - 2.5    CBC     Status: Abnormal   Collection Time   09/23/11  1:10 PM      Component Value Range Comment   WBC 15.3 (*) 4.0 - 10.5 (K/uL)    RBC 3.79 (*) 3.87 - 5.11 (MIL/uL)    Hemoglobin 11.8 (*) 12.0 - 15.0 (g/dL)    HCT 40.9 (*) 81.1 - 46.0 (%)    MCV 89.7  78.0 - 100.0 (fL)    MCH 31.1  26.0 - 34.0 (pg)    MCHC 34.7  30.0 - 36.0 (g/dL)    RDW 91.4 (*) 78.2 - 15.5 (%)    Platelets 185  150 - 400 (K/uL)   DIFFERENTIAL     Status: Abnormal   Collection Time   09/23/11  1:10 PM      Component Value Range Comment   Neutrophils Relative 92 (*) 43 - 77 (%)    Neutro Abs 14.0 (*) 1.7 - 7.7 (K/uL)    Lymphocytes Relative 3 (*) 12 - 46 (%)    Lymphs Abs 0.5 (*) 0.7 - 4.0 (K/uL)    Monocytes Relative 5  3 - 12 (%)    Monocytes Absolute 0.8  0.1 - 1.0 (K/uL)    Eosinophils Relative 0  0 - 5 (%)    Eosinophils Absolute 0.0  0.0 - 0.7 (K/uL)    Basophils Relative 0  0 - 1 (%)    Basophils Absolute 0.0  0.0 - 0.1 (K/uL)   BASIC METABOLIC PANEL     Status: Abnormal   Collection Time   09/23/11  1:10 PM      Component Value Range Comment   Sodium 130 (*) 135 - 145 (mEq/L)    Potassium 4.0  3.5 - 5.1 (mEq/L)    Chloride 95 (*) 96 -  112 (mEq/L)    CO2 24  19 - 32 (mEq/L)    Glucose, Bld 188 (*) 70 - 99 (mg/dL)    BUN 16  6 - 23 (mg/dL)    Creatinine, Ser 9.56  0.50 - 1.10 (mg/dL)    Calcium 8.8  8.4 - 10.5 (mg/dL)    GFR calc non Af Amer 85 (*) >90 (mL/min)    GFR calc Af Amer >90  >90 (mL/min)     Radiological Exams on Admission: Comparison: None.  Findings: There is an intertrochanteric fracture of the right femur  with varus angulation of the fracture fragments. Mild degenerative  changes in both hips. No dislocation. Osteopenia. Degenerative  changes in the visualized portion of the spine. Vascular  calcifications.   IMPRESSION:  1. Intertrochanteric right femur fracture. Per CMS PQRS reporting  requirements (PQRS Measure 24): Given the patient's age of greater  than 50 and the fracture site (hip, distal radius, or spine), the  patient should be tested for osteoporosis using DXA, and the  appropriate treatment considered based on the DXA results.  2. Osteopenia.  3. Spondylosis.   Assessment/Plan   *Fracture, intertrochanteric, right femur Admit to medical floor on tele Pain control with prn vicodin and low dose morphine ( monitor for further  bradycardia) Dr Samul Dada informed from ED and will evaluate patient  NPO for now Foley in place    Bradycardia Patient sinus brady in mid 40s and low 50s.  Asymptomatic  does not now if she has hx of low HR Will hold amlod Check TSH  monitor on tele     Hypertension Add prn  hydralazine  cont ACEi   Dyslipidemia Cont statin   Tobacco use Quit since 3 months. Hx of 60 pack yr   Hyperthyroidism Cont methimazole  check TSH  Leucocytosis  UA showing many  bacteria  urine cx ordered  will treat with cipro  Hx of left ICA stenosis  progress note from 2010 mentions of patient have 80$ left ICA stenosis and referred for CEA but patient denies having any such procedure Cont ASA   DVT prophylaxis  sq lovenix  NPO for possible  surgery  Code status  patient wishes to be DNR  Plan discussed with patient and her granddaughter   Time Spent on Admission: 55 minutes  Tyde Lamison 09/23/2011, 5:08 PM  Patient was unresponsive and pulseless at around 6 pm and CPR was done at bedside in the ED for 10-15 secs. EKG done showed a 3rd degree AV block. patient responsive shortly and talking in her usual sense. She is now sinus brady to mid 40s on tele. discussed options for pacemaker and she wants to d/w cardiology.  Cardiology consult called for evaluation. Will admit to 2900 stepdown . D/w PCCM who recommends the same.

## 2011-09-23 NOTE — ED Notes (Signed)
RN made aware of Cardiologist consult and BP.  Will continue to monitor.

## 2011-09-23 NOTE — ED Provider Notes (Signed)
History     CSN: [id number]  Arrival date & time 09/23/11  1152   First MD Initiated Contact with Patient 09/23/11 1159      Chief Complaint  Patient presents with  . Fall  . Hip Pain    Right    (Consider location/radiation/quality/duration/timing/severity/associated sxs/prior treatment) HPI History provided by pt.   Pt lives by herself.  Slipped on the stairs yesterday evening at approx 10-10:30pm.  Was unable to get up from ground.  Her neighbor checked on her this morning because he noticed that the light had been on all night, and then called EMS when he found her on the floor, incontinent of both urine and stool.  Pt is unsure of whether or not she hit her head.  Denies LOC, headache, dizziness, vomiting, neck/back pain.  C/o pain in right hip only.  No recent illnesses including fever, cough, vomiting, diarrhea, chest pain, SOB or urinary sx.  No dizziness or lightheadedness prior to fall.   Past Medical History  Diagnosis Date  . Hypertension   . Thyroid disease     No past surgical history on file.  No family history on file.  History  Substance Use Topics  . Smoking status: Not on file  . Smokeless tobacco: Not on file  . Alcohol Use: Not on file    OB History    No data available      Review of Systems  All other systems reviewed and are negative.    Allergies  Review of patient's allergies indicates not on file.  Home Medications  No current outpatient prescriptions on file.  BP 235/55  Pulse 42  Temp(Src) 97.5 F (36.4 C) (Oral)  Resp 18  SpO2 97%  Physical Exam  Nursing note and vitals reviewed. Constitutional: She is oriented to person, place, and time. She appears well-developed and well-nourished. No distress.  HENT:  Head: Normocephalic and atraumatic.  Mouth/Throat: Oropharynx is clear and moist.  Eyes:       Normal appearance  Neck: Normal range of motion.  Cardiovascular: Normal rate and regular rhythm.   Pulmonary/Chest:  Effort normal and breath sounds normal.  Musculoskeletal:       RLE externally rotated at hip w/ shortening.  Pelvis stable. Tenderness of right hip.  Pelvis stable.  Entire spine non-tender.   Neurological: She is alert and oriented to person, place, and time.       Pt is blind but otherwise no testable CN 5/5 and equal upper and lower extremity strength.  No sensory deficits.  No past pointing.     Skin: Skin is warm and dry. No rash noted.  Psychiatric: She has a normal mood and affect. Her behavior is normal.    ED Course  Procedures (including critical care time)   Date: 09/23/2011  Rate: 45  Rhythm: atrial fibrillation  QRS Axis: normal  Intervals: normal  ST/T Wave abnormalities: normal  Conduction Disutrbances:right bundle branch block  Narrative Interpretation:   Old EKG Reviewed: none available    Labs Reviewed  CBC - Abnormal; Notable for the following:    WBC 15.3 (*)    RBC 3.79 (*)    Hemoglobin 11.8 (*)    HCT 34.0 (*)    RDW 16.0 (*)    All other components within normal limits  DIFFERENTIAL - Abnormal; Notable for the following:    Neutrophils Relative 92 (*)    Neutro Abs 14.0 (*)    Lymphocytes Relative 3 (*)  Lymphs Abs 0.5 (*)    All other components within normal limits  BASIC METABOLIC PANEL - Abnormal; Notable for the following:    Sodium 130 (*)    Chloride 95 (*)    Glucose, Bld 188 (*)    GFR calc non Af Amer 85 (*)    All other components within normal limits  URINALYSIS, ROUTINE W REFLEX MICROSCOPIC - Abnormal; Notable for the following:    APPearance CLOUDY (*)    Hgb urine dipstick MODERATE (*)    Protein, ur >300 (*)    All other components within normal limits  URINE MICROSCOPIC-ADD ON - Abnormal; Notable for the following:    Bacteria, UA MANY (*) CHECKED   All other components within normal limits  CARDIAC PANEL(CRET KIN+CKTOT+MB+TROPI)  URINE CULTURE   Dg Chest 1 View  09/23/2011  *RADIOLOGY REPORT*  Clinical Data: Right  femoral neck fracture.  Preoperative respiratory evaluation.  CHEST - 1 VIEW 09/23/2011:  Comparison: None.  Findings: Cardiac silhouette moderately enlarged.  Prominent paracardiac fat pad on the left.  Thoracic aorta atherosclerotic. Hilar and mediastinal contours otherwise unremarkable.  Prominent bronchovascular markings diffusely and moderate central peribronchial thickening.  No localized airspace consolidation.  No evidence of interstitial pulmonary edema.  No visible pleural effusions.  Multiple left rib fractures, generalized osteopenia, and degenerative changes in the shoulder joints.  IMPRESSION: Moderate cardiomegaly without pulmonary edema.  Moderate changes of bronchitis and/or asthma, likely chronic.  No convincing evidence of acute cardiopulmonary disease.  Original Report Authenticated By: Arnell Sieving, M.D.   Dg Hip Complete Right  09/23/2011  *RADIOLOGY REPORT*  Clinical Data: Fall with right hip pain.  RIGHT HIP - COMPLETE 2+ VIEW  Comparison: None.  Findings: There is an intertrochanteric fracture of the right femur with varus angulation of the fracture fragments.  Mild degenerative changes in both hips.  No dislocation.  Osteopenia.  Degenerative changes in the visualized portion of the spine.  Vascular calcifications.  IMPRESSION:  1.  Intertrochanteric right femur fracture. Per CMS PQRS reporting requirements (PQRS Measure 24): Given the patient's age of greater than 50 and the fracture site (hip, distal radius, or spine), the patient should be tested for osteoporosis using DXA, and the appropriate treatment considered based on the DXA results. 2.  Osteopenia. 3.  Spondylosis.  Original Report Authenticated By: Reyes Ivan, M.D.   Ct Head Wo Contrast  09/23/2011  *RADIOLOGY REPORT*  Clinical Data:  Larey Seat.  Found down.  CT HEAD WITHOUT CONTRAST CT CERVICAL SPINE WITHOUT CONTRAST  Technique:  Multidetector CT imaging of the head and cervical spine was performed following the  standard protocol without intravenous contrast.  Multiplanar CT image reconstructions of the cervical spine were also generated.  Comparison:  None  CT HEAD  Findings: Age related cerebral atrophy, ventriculomegaly and periventricular white matter disease.  Remote appearing lacunar type infarct in the right basal ganglia region and mild ex vacuo dilatation of the frontal horn the right lateral ventricle.  No CT findings for acute hemispheric infarction and/or intracranial hemorrhage.  No extra-axial fluid collections are identified.  The brainstem and cerebellum grossly normal.  The bony structures are intact.  No skull fracture.  The paranasal sinuses and mastoid air cells are clear except for minimal debris in the right frontal sinus.  Osteoporotic changes involving the skull.  IMPRESSION:  1.  Age related cerebral atrophy, ventriculomegaly and periventricular white matter disease. 2.  Remote lacunar type basal ganglia infarcts. 3.  No skull  fracture or subdural hematoma.  CT CERVICAL SPINE  Findings: Advanced degenerative cervical spondylosis with disc disease and facet disease.  The overall alignment is maintained. Mild multilevel degenerative subluxations.  The facets are normally aligned.  Advanced facet degenerative changes.  The skull base C1 and C1-2 articulations are maintained.  The dens is intact.  The lung apices are clear.  IMPRESSION:  1.  Advanced degenerative cervical spondylosis with disc disease and facet disease. 2.  No acute cervical spine fracture.  Original Report Authenticated By: P. Loralie Champagne, M.D.   Ct Cervical Spine Wo Contrast  09/23/2011  *RADIOLOGY REPORT*  Clinical Data:  Larey Seat.  Found down.  CT HEAD WITHOUT CONTRAST CT CERVICAL SPINE WITHOUT CONTRAST  Technique:  Multidetector CT imaging of the head and cervical spine was performed following the standard protocol without intravenous contrast.  Multiplanar CT image reconstructions of the cervical spine were also generated.   Comparison:  None  CT HEAD  Findings: Age related cerebral atrophy, ventriculomegaly and periventricular white matter disease.  Remote appearing lacunar type infarct in the right basal ganglia region and mild ex vacuo dilatation of the frontal horn the right lateral ventricle.  No CT findings for acute hemispheric infarction and/or intracranial hemorrhage.  No extra-axial fluid collections are identified.  The brainstem and cerebellum grossly normal.  The bony structures are intact.  No skull fracture.  The paranasal sinuses and mastoid air cells are clear except for minimal debris in the right frontal sinus.  Osteoporotic changes involving the skull.  IMPRESSION:  1.  Age related cerebral atrophy, ventriculomegaly and periventricular white matter disease. 2.  Remote lacunar type basal ganglia infarcts. 3.  No skull fracture or subdural hematoma.  CT CERVICAL SPINE  Findings: Advanced degenerative cervical spondylosis with disc disease and facet disease.  The overall alignment is maintained. Mild multilevel degenerative subluxations.  The facets are normally aligned.  Advanced facet degenerative changes.  The skull base C1 and C1-2 articulations are maintained.  The dens is intact.  The lung apices are clear.  IMPRESSION:  1.  Advanced degenerative cervical spondylosis with disc disease and facet disease. 2.  No acute cervical spine fracture.  Original Report Authenticated By: P. Loralie Champagne, M.D.     1. Hip fracture       MDM  [phone] F presents w/ c/o mechanical fall yesterday evening and subsequent R hip pain.  Unsure of whether or not she hit her head but is not anti-coagulated, no focal neuro deficits on exam, CT head and cervical spine neg.  Exam sig for HTN (pt has not taken her am meds today) and RLE shortening and external rotation.  Labs ordered d/t my concern that pt might have mild dementia and be a poor historian, and sig for mild hyponatremia and leukocytosis.  Urine sent for culture.  Xray  right hip shows intertrochanteric fx.  All results discussed w/ pt and her granddaughter.  [facility] consulted for admission.  Discussed w/ Dr. Rennis Chris as well.         Arie Sabina [place], [place] 09/23/11 [phone]

## 2011-09-23 NOTE — Consult Note (Signed)
CARDIOLOGY CONSULT NOTE  Patient ID: Kathryn [name], MRN: [id number], DOB/AGE: 07-19-1922 78 y.o. Admit date: 09/23/2011 Date of Consult: 09/23/2011  Primary Physician: Sissy Hoff, MD, MD Primary Cardiologist: New to [place]  Chief Complaint: Fall at home Reason for Consultation: Complete AV Block  HPI: 78 y.o. female w/ PMHx significant for legal blindness 2/2 macular degeneration, former tobacco abuse, HTN, HLD, Hyperthyroidism, Left ICA stenosis, and no known cardiac history who presented to [facility] on 09/23/2011 after falling at home and now found to have right intertrochanteric fx of femur and complete heart block.  She is clear that her foot slipped and that she did not lose consciousness.  No known cardiac history. She lives alone and reports slipping on stairs last night and was unable to get up from the ground. Her neighbor checked on her this morning and found her on the ground, incontinent of stool and urine. C/o right hip pain. Transported to the ED. She denies chest pain, palpitations, dizziness other presyncopal symptoms prior to fall. She denies loss of consciousness.  In the ED she was found to have an intertrochanteric fracture of the right femur. EKG showed complete AV block, 40s. Cardiac enzymes negative. Other labs significant for WBC 15.3, H&H 11.8/34, Na 130, Glucose 188, nitrite negative UA. CXR revealed moderate cardiomegaly w/o pulm edema, no acute cardiopulmonary changes. CT head was without acute intracranial findings. She subsequently became unresponsive with granddaughter at bedside and found to be in v.fib on the monitor. CPR was performed for 10-15secs with return of spontaneous circulation. She has had no further arrhythmias on telemetry and remains in complete heart block with rates in the 40s. She is stable and able to give a full history. She would like to be a full code, but does not want to be "kept alive artificially".   She denies  palpitations or syncope.  She occasionally experiences lightheadedness when she skips a meal.  Past Medical History  Diagnosis Date  . Hypertension   . Hyperlipemia   . Hyperthyroidism   . Arthritis   . Depression   . Hip fracture 09/22/11    right; S/P fall  . Non-sustained ventricular tachycardia   . Third degree AV block   . Cerebrovascular disease       Surgical History:  Past Surgical History  Procedure Date  . Appendectomy 1943  . Laryngeal nodule ~ 1960's    removed     Home Meds: Medication Sig  amLODipine (NORVASC) 2.5 MG tablet Take 2.5 mg by mouth daily.  aspirin EC 325 MG tablet Take 325 mg by mouth daily.  Calcium-Vitamin D 600-200 MG-UNIT per tablet Take 1 tablet by mouth daily.  fish oil-omega-3 fatty acids 1000 MG capsule Take 1 g by mouth daily.  lisinopril (PRINIVIL,ZESTRIL) 20 MG tablet Take 40 mg by mouth daily.  methimazole (TAPAZOLE) 5 MG tablet Take 5 mg by mouth daily.  Multiple Vitamins-Minerals (OCUVITE PRESERVISION PO) Take 1 tablet by mouth daily.  pyridOXINE (VITAMIN B-6) 100 MG tablet Take 100 mg by mouth daily.  sertraline (ZOLOFT) 50 MG tablet Take 50 mg by mouth daily.  simvastatin (ZOCOR) 20 MG tablet Take 20 mg by mouth every evening.    Inpatient Medications:    .  morphine injection  2 mg Intravenous Once  .  morphine injection  4 mg Intravenous Once  . DISCONTD: amiodarone (NEXTERONE PREMIX) 360 mg/200 mL dextrose       Allergies: No Known Allergies  History  Social History  . Marital Status: Single    Spouse Name: N/A    Number of Children: N/A  . Years of Education: N/A   Occupational History  . Not on file.   Social History Main Topics  . Smoking status: Former Smoker -- 1.0 packs/day for 66 years    Types: Cigarettes    Quit date: 06/21/2011  . Smokeless tobacco: Never Used  . Alcohol Use: No  . Drug Use: No  . Sexually Active: No   Other Topics Concern  . Not on file   Social History Narrative  . Lives  alone.  Widowed.    Family History: No known family cardiac history   Review of Systems: General: negative for chills, fever, night sweats or weight changes.  Cardiovascular: (+) ankle edema; negative for chest pain, shortness of breath, dyspnea on exertion, orthopnea, palpitations, or paroxysmal nocturnal dyspnea Dermatological: negative for rash Respiratory: negative for cough or wheezing Urologic: (+) urinary incontinence; negative for hematuria Abdominal: negative for nausea, vomiting, diarrhea, bright red blood per rectum, melena, or hematemesis Neurologic: negative for visual changes, syncope, or dizziness All other systems reviewed and are otherwise negative except as noted above.  Labs:  [facility] 09/23/11 1300  CKTOTAL 64  CKMB 3.7  TROPONINI <0.30   Lab Results  Component Value Date   WBC 15.3* 09/23/2011   HGB 11.8* 09/23/2011   HCT 34.0* 09/23/2011   MCV 89.7 09/23/2011   PLT 185 09/23/2011    Lab 09/23/11 1310  NA 130*  K 4.0  CL 95*  CO2 24  BUN 16  CREATININE 0.50  CALCIUM 8.8  PROT --  BILITOT --  ALKPHOS --  ALT --  AST --  GLUCOSE 188*    Radiology/Studies:  Dg Chest 1 View  09/23/2011  *RADIOLOGY REPORT*  Clinical Data: Right femoral neck fracture.  Preoperative respiratory evaluation.  CHEST - 1 VIEW 09/23/2011:  Comparison: None.  Findings: Cardiac silhouette moderately enlarged.  Prominent paracardiac fat pad on the left.  Thoracic aorta atherosclerotic. Hilar and mediastinal contours otherwise unremarkable.  Prominent bronchovascular markings diffusely and moderate central peribronchial thickening.  No localized airspace consolidation.  No evidence of interstitial pulmonary edema.  No visible pleural effusions.  Multiple left rib fractures, generalized osteopenia, and degenerative changes in the shoulder joints.  IMPRESSION: Moderate cardiomegaly without pulmonary edema.  Moderate changes of bronchitis and/or asthma, likely chronic.  No convincing  evidence of acute cardiopulmonary disease.  Original Report Authenticated By: Arnell Sieving, M.D.   Dg Hip Complete Right  09/23/2011  *RADIOLOGY REPORT*  Clinical Data: Fall with right hip pain.  RIGHT HIP - COMPLETE 2+ VIEW  Comparison: None.  Findings: There is an intertrochanteric fracture of the right femur with varus angulation of the fracture fragments.  Mild degenerative changes in both hips.  No dislocation.  Osteopenia.  Degenerative changes in the visualized portion of the spine.  Vascular calcifications.  IMPRESSION:  1.  Intertrochanteric right femur fracture. Per CMS PQRS reporting requirements (PQRS Measure 24): Given the patient's age of greater than 50 and the fracture site (hip, distal radius, or spine), the patient should be tested for osteoporosis using DXA, and the appropriate treatment considered based on the DXA results. 2.  Osteopenia. 3.  Spondylosis.  Original Report Authenticated By: Reyes Ivan, M.D.   Ct Head Wo Contrast  09/23/2011  *RADIOLOGY REPORT*  Clinical Data:  Larey Seat.  Found down.  CT HEAD WITHOUT CONTRAST CT CERVICAL SPINE WITHOUT CONTRAST  Technique:  Multidetector CT imaging of the head and cervical spine was performed following the standard protocol without intravenous contrast.  Multiplanar CT image reconstructions of the cervical spine were also generated.  Comparison:  None  CT HEAD  Findings: Age related cerebral atrophy, ventriculomegaly and periventricular white matter disease.  Remote appearing lacunar type infarct in the right basal ganglia region and mild ex vacuo dilatation of the frontal horn the right lateral ventricle.  No CT findings for acute hemispheric infarction and/or intracranial hemorrhage.  No extra-axial fluid collections are identified.  The brainstem and cerebellum grossly normal.  The bony structures are intact.  No skull fracture.  The paranasal sinuses and mastoid air cells are clear except for minimal debris in the right frontal  sinus.  Osteoporotic changes involving the skull.  IMPRESSION:  1.  Age related cerebral atrophy, ventriculomegaly and periventricular white matter disease. 2.  Remote lacunar type basal ganglia infarcts. 3.  No skull fracture or subdural hematoma.  CT CERVICAL SPINE  Findings: Advanced degenerative cervical spondylosis with disc disease and facet disease.  The overall alignment is maintained. Mild multilevel degenerative subluxations.  The facets are normally aligned.  Advanced facet degenerative changes.  The skull base C1 and C1-2 articulations are maintained.  The dens is intact.  The lung apices are clear.  IMPRESSION:  1.  Advanced degenerative cervical spondylosis with disc disease and facet disease. 2.  No acute cervical spine fracture.  Original Report Authenticated By: P. Loralie Champagne, M.D.   Ct Cervical Spine Wo Contrast  09/23/2011  *RADIOLOGY REPORT*  Clinical Data:  Larey Seat.  Found down.  CT HEAD WITHOUT CONTRAST CT CERVICAL SPINE WITHOUT CONTRAST  Technique:  Multidetector CT imaging of the head and cervical spine was performed following the standard protocol without intravenous contrast.  Multiplanar CT image reconstructions of the cervical spine were also generated.  Comparison:  None  CT HEAD  Findings: Age related cerebral atrophy, ventriculomegaly and periventricular white matter disease.  Remote appearing lacunar type infarct in the right basal ganglia region and mild ex vacuo dilatation of the frontal horn the right lateral ventricle.  No CT findings for acute hemispheric infarction and/or intracranial hemorrhage.  No extra-axial fluid collections are identified.  The brainstem and cerebellum grossly normal.  The bony structures are intact.  No skull fracture.  The paranasal sinuses and mastoid air cells are clear except for minimal debris in the right frontal sinus.  Osteoporotic changes involving the skull.  IMPRESSION:  1.  Age related cerebral atrophy, ventriculomegaly and periventricular  white matter disease. 2.  Remote lacunar type basal ganglia infarcts. 3.  No skull fracture or subdural hematoma.  CT CERVICAL SPINE  Findings: Advanced degenerative cervical spondylosis with disc disease and facet disease.  The overall alignment is maintained. Mild multilevel degenerative subluxations.  The facets are normally aligned.  Advanced facet degenerative changes.  The skull base C1 and C1-2 articulations are maintained.  The dens is intact.  The lung apices are clear.  IMPRESSION:  1.  Advanced degenerative cervical spondylosis with disc disease and facet disease. 2.  No acute cervical spine fracture.  Original Report Authenticated By: P. Loralie Champagne, M.D.    EKG:  09/23/11 @ 1359 - Complete AV Block 45bpm; long QTc; incomplete RBBB  09/23/11 @ 1829 - Complete AV Block 49bpm  Physical Exam: Blood pressure 183/57, pulse 45, temperature 97.6 F (36.4 C), temperature source Oral, resp. rate 17, SpO2 99.00%. General: Pleasant elderly white female in no acute  distress. Head: Normocephalic, atraumatic, sclera non-icteric, no xanthomas, nares are without discharge.  Neck: Supple. Harsh bilateral carotid bruits with normal upstrokes. JVD not elevated. Lungs: Clear bilaterally to auscultation without wheezes, rales, or rhonchi. Breathing is unlabored. Heart: Bradycardic, regular rhythm with S1 S2. 2-3/6 SEM R/LUSB radiated to neck; No rubs, or gallops appreciated. Abdomen: Soft, non-tender, non-distended with normoactive bowel sounds. No hepatomegaly. No rebound/guarding. No obvious abdominal masses. Msk:  Strength and tone appear normal for age. Extremities: No clubbing or cyanosis. Trace bilat ankle edema.  Pedal pulses are weak R>L; R leg shortened and externally rotated Neuro: Alert and oriented X 3. Moves all extremities spontaneously. Psych:  Responds to questions appropriately with a normal affect.   Assessment and Plan:  77 y.o. female w/ PMHx significant for legal blindness 2/2  macular degeneration, former tobacco abuse, HTN, HLD, Hyperthyroidism, Left ICA stenosis, and no known cardiac history who presented to [facility] on 09/23/2011 after falling at home and now found to have right intertrochanteric fx of femur and complete heart block.  1. Complete AV Block: She denies a history of prior cardiac disease or arrhythmias/heart block. States her normal heart rate is in the 60s. Has never seen a cardiologist and hasn't had an EKG in many years. She denies chest pain, sob, palpitations, dizziness, or syncope. She now presents after a fall and right hip frx and was found to be in complete heart block complicated by V. Fib arrest that appears to be torsades after reviewing telemetry. She regained consciousness with 10-15secs of CPR only and has had no further arrhythmias on telemetry. Will supplement Mg, keep Zoll pads in place, hold zoloft and CCB or any other AV nodal blockers, obtain echocardiogram, place in ICU, and plan for dual-chamber pacemaker tomorrow, or sooner if decompensates. She wishes to be a full code.  Signed, HOPE, JESSICA PA-C 09/23/2011, 6:59 PM  Cardiology Attending Patient interviewed and examined. Discussed with [facility], PA.  Above note annotated and modified based upon my findings.  Fairly feisty and independent octogenarian with good quality of life prior to current medical problems.  She has ASVD including cerebrovascular and peripheral vascular disease and certainly could have coronary disease as well.  Assuming that cardiac markers remain normal and LV function is normal by echocardiography, additional cardiac testing should not be necessary.  Limited tracings of arrhythmia that are available are suggestive of torsade initiated by a PVC.  She also has a long QT and is being treated with an antidepressant, which has been discontinued.  We have tentatively planned for dual chamber pacing tomorrow.  I anticipate that pacing alone will prevent  additional ventricular arrhythmias.  IV Mg will be given tonight.  Westbrook Bing, MD 09/23/2011, 8:35 PM

## 2011-09-23 NOTE — ED Notes (Addendum)
Dr. Juleen China at the bedside talking to the patient and the family about a possible pacemaker implant but the patient refused. The patient said that her doctor told her that she needed a pacemaker but refused it because she said that she knows people who did not do well with it. Dr. Juleen China is explaining to the patient the benefits of having one. Dr. Gonzella Lex in to see the patient.

## 2011-09-23 NOTE — ED Provider Notes (Signed)
Called to bedside by nursing because the patient became  unresponsive. Patient was alone in the room with her daughter when she just stopped talking and became apenic. Whe nursing arrived patient was unresponsive and pulseless. Chest compressions were performed for about 10-15 seconds with return of spontaneous circulation. No meds given. Patient now alert and at her baseline. Pads on. Review of rhythm strips showed an episode of what appears to be ventricular tachycardia. EKG afterward shows third degree heart block. Discussed case with Dr. Dietrich Pates, cardiology. Will evaluate patient in the emergency room.   Raeford Razor, MD 09/23/11 [phone]

## 2011-09-23 NOTE — ED Notes (Signed)
Pt noted to be responding,  Breathing on her own but noted to be on 3rd degree heart block. O2 started via NRM, vital signs checked. Ortho PA came to see the patient and is talking to the daughter.

## 2011-09-23 NOTE — ED Notes (Addendum)
Responded to daughter's call for help and found patient unresponsive and not breathing. Pt was noted to be on ventricular fibrillation on the monitor. CPR started, breathing assisted via BVM. Pacer pads attaches to the patient.

## 2011-09-23 NOTE — ED Provider Notes (Signed)
Medical screening examination/treatment/procedure(s) were performed by non-physician practitioner and as supervising physician I was immediately available for consultation/collaboration.   Laray Anger, DO 09/23/11 2012

## 2011-09-23 NOTE — ED Notes (Signed)
Pt was medicated for pain to the rt hip as ordered. Will continue to monitor

## 2011-09-24 ENCOUNTER — Inpatient Hospital Stay (HOSPITAL_COMMUNITY): Payer: Medicare Other | Admitting: Anesthesiology

## 2011-09-24 ENCOUNTER — Encounter (HOSPITAL_COMMUNITY): Payer: Self-pay | Admitting: Anesthesiology

## 2011-09-24 ENCOUNTER — Encounter (HOSPITAL_COMMUNITY)
Admission: EM | Disposition: A | Discharge status: Skilled Nursing Facility | Payer: Self-pay | Source: Home / Self Care | Attending: Internal Medicine

## 2011-09-24 ENCOUNTER — Inpatient Hospital Stay (HOSPITAL_COMMUNITY): Payer: Medicare Other

## 2011-09-24 ENCOUNTER — Other Ambulatory Visit: Payer: Self-pay

## 2011-09-24 DIAGNOSIS — I442 Atrioventricular block, complete: Secondary | ICD-10-CM

## 2011-09-24 DIAGNOSIS — I369 Nonrheumatic tricuspid valve disorder, unspecified: Secondary | ICD-10-CM

## 2011-09-24 HISTORY — PX: PERMANENT PACEMAKER INSERTION: SHX5480

## 2011-09-24 LAB — CARDIAC PANEL(CRET KIN+CKTOT+MB+TROPI): Relative Index: INVALID (ref 0.0–2.5)

## 2011-09-24 LAB — PROTIME-INR: Prothrombin Time: 16.1 seconds — ABNORMAL HIGH (ref 11.6–15.2)

## 2011-09-24 LAB — CBC
HCT: 29.4 % — ABNORMAL LOW (ref 36.0–46.0)
Hemoglobin: 10.3 g/dL — ABNORMAL LOW (ref 12.0–15.0)
MCV: 89.4 fL (ref 78.0–100.0)
WBC: 11.8 10*3/uL — ABNORMAL HIGH (ref 4.0–10.5)

## 2011-09-24 LAB — BASIC METABOLIC PANEL
BUN: 18 mg/dL (ref 6–23)
CO2: 25 mEq/L (ref 19–32)
Chloride: 98 mEq/L (ref 96–112)
GFR calc Af Amer: >90 mL/min (ref >90)
Potassium: 3.7 mEq/L (ref 3.5–5.1)

## 2011-09-24 LAB — APTT: aPTT: 47 seconds — ABNORMAL HIGH (ref 24–37)

## 2011-09-24 SURGERY — PERMANENT PACEMAKER INSERTION
Anesthesia: LOCAL

## 2011-09-24 SURGERY — PERMANENT PACEMAKER INSERTION
Anesthesia: Moderate Sedation | Laterality: Left

## 2011-09-24 SURGERY — FIXATION, FRACTURE, INTERTROCHANTERIC, WITH INTRAMEDULLARY ROD
Anesthesia: Choice | Site: Hip | Laterality: Right | Wound class: Clean

## 2011-09-24 MED ORDER — SODIUM CHLORIDE 0.9 % IR SOLN
80.0000 mg | Status: AC
  Administered 2011-09-24: 80 mg
  Filled 2011-09-24 (×3): qty 2

## 2011-09-24 MED ORDER — METOCLOPRAMIDE HCL 5 MG/ML IJ SOLN
5.0000 mg | Freq: Three times a day (TID) | INTRAMUSCULAR | Status: DC | PRN
  Filled 2011-09-24: qty 2

## 2011-09-24 MED ORDER — LIDOCAINE HCL (PF) 1 % IJ SOLN
INTRAMUSCULAR | Status: AC
  Filled 2011-09-24: qty 60

## 2011-09-24 MED ORDER — PROPOFOL 10 MG/ML IV EMUL
INTRAVENOUS | Status: DC | PRN
  Administered 2011-09-24: 30 mg via INTRAVENOUS
  Administered 2011-09-24: 130 mg via INTRAVENOUS

## 2011-09-24 MED ORDER — CHLORHEXIDINE GLUCONATE 4 % EX LIQD
Freq: Once | CUTANEOUS | Status: AC
  Administered 2011-09-24: 12:00:00 via TOPICAL

## 2011-09-24 MED ORDER — BIOTENE DRY MOUTH MT LIQD
15.0000 mL | Freq: Two times a day (BID) | OROMUCOSAL | Status: DC
  Administered 2011-09-24 – 2011-09-28 (×4): 15 mL via OROMUCOSAL

## 2011-09-24 MED ORDER — SODIUM CHLORIDE 0.9 % IV SOLN: INTRAVENOUS | Status: DC

## 2011-09-24 MED ORDER — ENOXAPARIN SODIUM 30 MG/0.3ML ~~LOC~~ SOLN
30.0000 mg | SUBCUTANEOUS | Status: DC
  Filled 2011-09-24: qty 0.3

## 2011-09-24 MED ORDER — DROPERIDOL 2.5 MG/ML IJ SOLN
0.6250 mg | INTRAMUSCULAR | Status: DC | PRN
  Filled 2011-09-24: qty 0.25

## 2011-09-24 MED ORDER — LACTATED RINGERS IV SOLN
INTRAVENOUS | Status: DC | PRN
  Administered 2011-09-24: 15:00:00 via INTRAVENOUS

## 2011-09-24 MED ORDER — MIDAZOLAM HCL 5 MG/5ML IJ SOLN
INTRAMUSCULAR | Status: AC
  Filled 2011-09-24: qty 5

## 2011-09-24 MED ORDER — HYDROCODONE-ACETAMINOPHEN 5-325 MG PO TABS: 1.0000 | ORAL_TABLET | ORAL | Status: DC | PRN

## 2011-09-24 MED ORDER — CEFAZOLIN SODIUM 1-5 GM-% IV SOLN
1.0000 g | INTRAVENOUS | Status: AC
  Administered 2011-09-24: 1 g via INTRAVENOUS
  Filled 2011-09-24 (×2): qty 50

## 2011-09-24 MED ORDER — NITROGLYCERIN IN D5W 200-5 MCG/ML-% IV SOLN
2.0000 ug/min | INTRAVENOUS | Status: DC
  Administered 2011-09-24: 10 ug/min via INTRAVENOUS

## 2011-09-24 MED ORDER — FENTANYL CITRATE 0.05 MG/ML IJ SOLN
INTRAMUSCULAR | Status: DC | PRN
  Administered 2011-09-24: 100 ug via INTRAVENOUS
  Administered 2011-09-24: 150 ug via INTRAVENOUS

## 2011-09-24 MED ORDER — MENTHOL 3 MG MT LOZG
1.0000 | LOZENGE | OROMUCOSAL | Status: DC | PRN
  Filled 2011-09-24: qty 9

## 2011-09-24 MED ORDER — ACETAMINOPHEN 325 MG PO TABS: 325.0000 mg | ORAL_TABLET | ORAL | Status: DC | PRN

## 2011-09-24 MED ORDER — SODIUM CHLORIDE 0.45 % IV SOLN: INTRAVENOUS | Status: DC

## 2011-09-24 MED ORDER — DEXTROSE 50 % IV SOLN
INTRAVENOUS | Status: AC
  Filled 2011-09-24: qty 50

## 2011-09-24 MED ORDER — HEPARIN (PORCINE) IN NACL 2-0.9 UNIT/ML-% IJ SOLN
INTRAMUSCULAR | Status: AC
  Filled 2011-09-24: qty 1000

## 2011-09-24 MED ORDER — ONDANSETRON HCL 4 MG/2ML IJ SOLN
INTRAMUSCULAR | Status: DC | PRN
  Administered 2011-09-24: 4 mg via INTRAVENOUS

## 2011-09-24 MED ORDER — NITROGLYCERIN IN D5W 200-5 MCG/ML-% IV SOLN
INTRAVENOUS | Status: AC
  Administered 2011-09-24: 10 ug/min via INTRAVENOUS
  Filled 2011-09-24: qty 250

## 2011-09-24 MED ORDER — LIDOCAINE HCL (CARDIAC) 20 MG/ML IV SOLN
INTRAVENOUS | Status: DC | PRN
  Administered 2011-09-24: 50 mg via INTRAVENOUS

## 2011-09-24 MED ORDER — METOCLOPRAMIDE HCL 5 MG PO TABS
5.0000 mg | ORAL_TABLET | Freq: Three times a day (TID) | ORAL | Status: DC | PRN
  Filled 2011-09-24: qty 2

## 2011-09-24 MED ORDER — CEFAZOLIN SODIUM 1-5 GM-% IV SOLN
1.0000 g | Freq: Four times a day (QID) | INTRAVENOUS | Status: AC
  Administered 2011-09-24 – 2011-09-25 (×3): 1 g via INTRAVENOUS
  Filled 2011-09-24 (×3): qty 50

## 2011-09-24 MED ORDER — ONDANSETRON HCL 4 MG/2ML IJ SOLN: 4.0000 mg | Freq: Four times a day (QID) | INTRAMUSCULAR | Status: DC | PRN

## 2011-09-24 MED ORDER — HYDROMORPHONE HCL PF 1 MG/ML IJ SOLN: 0.2500 mg | INTRAMUSCULAR | Status: DC | PRN

## 2011-09-24 MED ORDER — ENOXAPARIN SODIUM 30 MG/0.3ML ~~LOC~~ SOLN
30.0000 mg | SUBCUTANEOUS | Status: DC
  Administered 2011-09-25 – 2011-09-28 (×4): 30 mg via SUBCUTANEOUS
  Filled 2011-09-24 (×3): qty 0.3
  Filled 2011-09-24 (×2): qty 0.4

## 2011-09-24 MED ORDER — HYDRALAZINE HCL 20 MG/ML IJ SOLN
10.0000 mg | INTRAMUSCULAR | Status: DC | PRN
  Administered 2011-09-24 – 2011-09-26 (×3): 10 mg via INTRAVENOUS
  Filled 2011-09-24 (×2): qty 1
  Filled 2011-09-24: qty 0.5
  Filled 2011-09-24: qty 1

## 2011-09-24 MED ORDER — SUCCINYLCHOLINE CHLORIDE 20 MG/ML IJ SOLN
INTRAMUSCULAR | Status: DC | PRN
  Administered 2011-09-24: 100 mg via INTRAVENOUS

## 2011-09-24 MED ORDER — CHLORHEXIDINE GLUCONATE 0.12 % MT SOLN
15.0000 mL | Freq: Two times a day (BID) | OROMUCOSAL | Status: DC
Start: 2011-09-24 — End: 2011-09-24
  Administered 2011-09-24: 15 mL via OROMUCOSAL
  Filled 2011-09-24: qty 15

## 2011-09-24 MED ORDER — PHENOL 1.4 % MT LIQD
1.0000 | OROMUCOSAL | Status: DC | PRN
  Filled 2011-09-24: qty 177

## 2011-09-24 MED ORDER — ACETAMINOPHEN 650 MG RE SUPP: 650.0000 mg | Freq: Four times a day (QID) | RECTAL | Status: DC | PRN

## 2011-09-24 MED ORDER — CHLORHEXIDINE GLUCONATE 4 % EX LIQD
CUTANEOUS | Status: AC
  Filled 2011-09-24: qty 60

## 2011-09-24 MED ORDER — SODIUM CHLORIDE 0.9 % IV SOLN
INTRAVENOUS | Status: DC
  Administered 2011-09-24: 01:00:00 via INTRAVENOUS

## 2011-09-24 MED ORDER — CEFAZOLIN SODIUM 1-5 GM-% IV SOLN: 1.0000 g | Freq: Four times a day (QID) | INTRAVENOUS | Status: DC

## 2011-09-24 MED ORDER — ACETAMINOPHEN 325 MG PO TABS
650.0000 mg | ORAL_TABLET | Freq: Four times a day (QID) | ORAL | Status: DC | PRN
  Filled 2011-09-24: qty 2

## 2011-09-24 MED ORDER — ONDANSETRON HCL 4 MG PO TABS: 4.0000 mg | ORAL_TABLET | Freq: Four times a day (QID) | ORAL | Status: DC | PRN

## 2011-09-24 MED ORDER — 0.9 % SODIUM CHLORIDE (POUR BTL) OPTIME
TOPICAL | Status: DC | PRN
  Administered 2011-09-24: 1000 mL

## 2011-09-24 MED ORDER — FENTANYL CITRATE 0.05 MG/ML IJ SOLN
INTRAMUSCULAR | Status: AC
  Filled 2011-09-24: qty 2

## 2011-09-24 SURGICAL SUPPLY — 42 items
BANDAGE CONFORM 3  STR LF (GAUZE/BANDAGES/DRESSINGS) ×4 IMPLANT
BIT DRILL CANN LG 4.3MM (BIT) ×1 IMPLANT
BLADE SURG 15 STRL LF DISP TIS (BLADE) ×1 IMPLANT
BLADE SURG 15 STRL SS (BLADE) ×1
CLOTH BEACON ORANGE TIMEOUT ST (SAFETY) ×2 IMPLANT
DRAPE STERI IOBAN 125X83 (DRAPES) ×2 IMPLANT
DRILL BIT CANN LG 4.3MM (BIT) ×2
DRSG ADAPTIC 3X8 NADH LF (GAUZE/BANDAGES/DRESSINGS) ×2 IMPLANT
DRSG MEPILEX BORDER 4X4 (GAUZE/BANDAGES/DRESSINGS) ×4 IMPLANT
DRSG MEPILEX BORDER 4X8 (GAUZE/BANDAGES/DRESSINGS) ×2 IMPLANT
ELECT REM PT RETURN 9FT ADLT (ELECTROSURGICAL) ×2
ELECTRODE REM PT RTRN 9FT ADLT (ELECTROSURGICAL) ×1 IMPLANT
GLOVE BIO SURGEON STRL SZ7 (GLOVE) ×6 IMPLANT
GLOVE BIO SURGEON STRL SZ7.5 (GLOVE) ×2 IMPLANT
GLOVE BIO SURGEON STRL SZ8 (GLOVE) ×2 IMPLANT
GLOVE BIOGEL PI IND STRL 7.5 (GLOVE) ×2 IMPLANT
GLOVE BIOGEL PI INDICATOR 7.5 (GLOVE) ×2
GLOVE EUDERMIC 7 POWDERFREE (GLOVE) ×2 IMPLANT
GLOVE SS BIOGEL STRL SZ 7.5 (GLOVE) ×1 IMPLANT
GLOVE SUPERSENSE BIOGEL SZ 7.5 (GLOVE) ×1
GOWN STRL NON-REIN LRG LVL3 (GOWN DISPOSABLE) ×2 IMPLANT
GOWN STRL REIN XL XLG (GOWN DISPOSABLE) ×4 IMPLANT
GUIDEWIRE BALL NOSE 80CM (WIRE) ×2 IMPLANT
KIT BASIN OR (CUSTOM PROCEDURE TRAY) ×2 IMPLANT
KIT ROOM TURNOVER OR (KITS) ×2 IMPLANT
MANIFOLD NEPTUNE II (INSTRUMENTS) ×2 IMPLANT
NAIL HIP FRACT 130D 11X180 (Screw) ×2 IMPLANT
NS IRRIG 1000ML POUR BTL (IV SOLUTION) ×2 IMPLANT
PACK GENERAL/GYN (CUSTOM PROCEDURE TRAY) ×2 IMPLANT
PAD ARMBOARD 7.5X6 YLW CONV (MISCELLANEOUS) ×4 IMPLANT
SCREW BONE CORTICAL 5.0X3 (Screw) ×2 IMPLANT
SCREW LAG HIP NAIL 10.5X95 (Screw) ×2 IMPLANT
SPONGE LAP 18X18 X RAY DECT (DISPOSABLE) ×2 IMPLANT
STAPLER VISISTAT 35W (STAPLE) ×4 IMPLANT
STRIP CLOSURE SKIN 1/2X4 (GAUZE/BANDAGES/DRESSINGS) ×2 IMPLANT
SUT MNCRL AB 3-0 PS2 18 (SUTURE) ×2 IMPLANT
SUT VIC AB 0 CT1 27 (SUTURE) ×1
SUT VIC AB 0 CT1 27XBRD ANBCTR (SUTURE) ×1 IMPLANT
SUT VIC AB 2-0 CT1 27 (SUTURE) ×1
SUT VIC AB 2-0 CT1 TAPERPNT 27 (SUTURE) ×1 IMPLANT
TRAY FOLEY CATH 14FR (SET/KITS/TRAYS/PACK) IMPLANT
WATER STERILE IRR 1000ML POUR (IV SOLUTION) ×2 IMPLANT

## 2011-09-24 NOTE — Consult Note (Signed)
Pt was a 1 ppd smoker but states she quits 3 months ago and did it cold [place]. She plans to remain tobacco free. Congratulated and encouraged pt to stay tobacco free. Discussed and reviewed relapse prevention strategies. Referred to 1-800 quit now for f/u and support. Discussed second hand smoke and in home smoking policy. Reviewed and gave pt Written education/contact information.

## 2011-09-24 NOTE — Anesthesia Postprocedure Evaluation (Signed)
Anesthesia Post Note  Patient: Kathryn [name]  Procedure(s) Performed: Procedure(s) (LRB): INTRAMEDULLARY (IM) NAIL INTERTROCHANTRIC (Right)  Anesthesia type: general  Patient location: PACU  Post pain: Pain level controlled  Post assessment: Patient's Cardiovascular Status Stable  Last Vitals:  Filed Vitals:   09/24/11 1730  BP: 189/69  Pulse: 76  Temp:   Resp: 10    Post vital signs: Reviewed and stable  Level of consciousness: sedated  Complications: No apparent anesthesia complications

## 2011-09-24 NOTE — Transfer of Care (Signed)
Immediate Anesthesia Transfer of Care Note  Patient: Kathryn [name]  Procedure(s) Performed: Procedure(s) (LRB): INTRAMEDULLARY (IM) NAIL INTERTROCHANTRIC (Right)  Patient Location: PACU  Anesthesia Type: General  Level of Consciousness: sedated and patient cooperative  Airway & Oxygen Therapy: Patient Spontanous Breathing and Patient connected to nasal cannula oxygen  Post-op Assessment: Report given to PACU RN and Post -op Vital signs reviewed and stable  Post vital signs: Reviewed and stable  Complications: No apparent anesthesia complications

## 2011-09-24 NOTE — Progress Notes (Signed)
Utilization Review Completed.  Celine Dishman T  09/24/2011  

## 2011-09-24 NOTE — Progress Notes (Signed)
Orthopedic Tech Progress Note Patient Details:  Kathryn [name] 10/02/1922 [id number] Notified rn that patient has the wrong bed for overhead frame, did not apply. Patient ID: [name], female   DOB: 07-Nov-1922, 78 y.o.   MRN: [id number]   Jennye Moccasin 09/24/2011, 6:11 PM

## 2011-09-24 NOTE — Op Note (Signed)
NAMEVALERIA, BOZA NO.:  [id number]  MEDICAL RECORD NO.:  [id number]  LOCATION:  2910                         FACILITY:  [facility]  PHYSICIAN:  Vania Rea. Mylea Roarty, M.D.  DATE OF BIRTH:  1922-08-19  DATE OF PROCEDURE: DATE OF DISCHARGE:                              OPERATIVE REPORT   ADDENDUM  This is an addendum to the surgical note dictation I just completed on Eunice O'Sullivan.  Could you please include within the text of the previous noted, Ralene Bathe, P.A.-C. was utilized as an Geophysicist/field seismologist throughout this case to assist with positioning, extremity retraction, management of the instruments, and closure and facilitation of the procedure in expedient fashion and intraoperative decision making.     Vania Rea. Aurielle Slingerland, M.D.     KMS/MEDQ  D:  09/24/2011  T:  09/24/2011  Job:  [id number]

## 2011-09-24 NOTE — Progress Notes (Addendum)
[facility]  Interim history: 78 y/o female admitted after a fall and fracture of right hip and found to have 3rd degree heart block. Undergoing pacemaker placement today.   Subjective: No significant complaints today. Pain in right hip is only when moving.   Objective: Blood pressure 158/72, pulse 46, temperature 98.7 F (37.1 C), temperature source Oral, resp. rate 17, height 5' (1.524 m), weight 43.9 kg (96 lb 12.5 oz), SpO2 96.00%. Weight change:   Intake/Output Summary (Last 24 hours) at 09/24/11 1615 Last data filed at 09/24/11 1600  Gross per 24 hour  Intake   2035 ml  Output    380 ml  Net   1655 ml    Physical Exam: Constitutional: Vital signs reviewed. Patient is an elderly female in no acute distress and cooperative with exam. Alert and oriented x3.  Head: Normocephalic and atraumatic  Ear: TM normal bilaterally  Mouth: no erythema or exudates, MMM  Eyes: PERRL, EOMI, conjunctivae normal, No scleral icterus.  Cardiovascular: RRR, S1 normal, S2 normal, no MRG, pulses symmetric and intact bilaterally  Pulmonary/Chest: CTAB, no wheezes, rales, or rhonchi  Abdominal: Soft. Non-tender, non-distended, bowel sounds are normal, no masses, organomegaly, or guarding present.  GU: no CVA tenderness Musculoskeletal:rt leg externally rotated, painful on movement and ROM, No joint deformities, erythema, or stiffness,  Ext: no edema and no cyanosis, pulses palpable bilaterally (DP and PT)  Neurological: A&O x3, Strenght is normal and symmetric bilaterally, cranial nerve II-XII are grossly intact, no focal motor deficit, sensory intact to light touch bilaterally.    Lab Results:  Basename 09/24/11 0525 09/23/11 1907 09/23/11 1310  NA 131* -- 130*  K 3.7 -- 4.0  CL 98 -- 95*  CO2 25 -- 24  GLUCOSE 139* -- 188*  BUN 18 -- 16  CREATININE 0.60 -- 0.50  CALCIUM 9.0 -- 8.8  MG 2.3 1.6 --  PHOS -- -- --   No results found for this basename:  AST:2,ALT:2,ALKPHOS:2,BILITOT:2,PROT:2,ALBUMIN:2 in the last 72 hours No results found for this basename: LIPASE:2,AMYLASE:2 in the last 72 hours  Basename 09/24/11 0525 09/23/11 1310  WBC 11.8* 15.3*  NEUTROABS -- 14.0*  HGB 10.3* 11.8*  HCT 29.4* 34.0*  MCV 89.4 89.7  PLT 171 185    Basename 09/24/11 0525 09/23/11 1907 09/23/11 1300  CKTOTAL 95 -- 64  CKMB 3.9 -- 3.7  CKMBINDEX -- -- --  TROPONINI <0.30 <0.30 <0.30   No components found with this basename: POCBNP:3 No results found for this basename: DDIMER:2 in the last 72 hours No results found for this basename: HGBA1C:2 in the last 72 hours No results found for this basename: CHOL:2,HDL:2,LDLCALC:2,TRIG:2,CHOLHDL:2,LDLDIRECT:2 in the last 72 hours  Basename 09/23/11 1907  TSH 1.732  T4TOTAL --  T3FREE --  THYROIDAB --   No results found for this basename: VITAMINB12:2,FOLATE:2,FERRITIN:2,TIBC:2,IRON:2,RETICCTPCT:2 in the last 72 hours  Micro Results: Recent Results (from the past 240 hour(s))  URINE CULTURE     Status: Normal (Preliminary result)   Collection Time   09/23/11 12:42 PM      Component Value Range Status Comment   Specimen Description URINE, CLEAN CATCH   Final    Special Requests ADDED ON [id number] @1508    Final    Culture  Setup Time [id number]   Final    Colony Count PENDING   Incomplete    Culture Culture reincubated for better growth   Final    Report Status PENDING   Incomplete   MRSA PCR SCREENING  Status: Normal   Collection Time   09/23/11  8:49 PM      Component Value Range Status Comment   MRSA by PCR NEGATIVE  NEGATIVE  Final   SURGICAL PCR SCREEN     Status: Normal   Collection Time   09/24/11 12:19 PM      Component Value Range Status Comment   MRSA, PCR NEGATIVE  NEGATIVE  Final    Staphylococcus aureus NEGATIVE  NEGATIVE  Final     Studies/Results: Dg Chest 1 View  09/23/2011  *RADIOLOGY REPORT*  Clinical Data: Right femoral neck fracture.  Preoperative respiratory  evaluation.  CHEST - 1 VIEW 09/23/2011:  Comparison: None.  Findings: Cardiac silhouette moderately enlarged.  Prominent paracardiac fat pad on the left.  Thoracic aorta atherosclerotic. Hilar and mediastinal contours otherwise unremarkable.  Prominent bronchovascular markings diffusely and moderate central peribronchial thickening.  No localized airspace consolidation.  No evidence of interstitial pulmonary edema.  No visible pleural effusions.  Multiple left rib fractures, generalized osteopenia, and degenerative changes in the shoulder joints.  IMPRESSION: Moderate cardiomegaly without pulmonary edema.  Moderate changes of bronchitis and/or asthma, likely chronic.  No convincing evidence of acute cardiopulmonary disease.  Original Report Authenticated By: Arnell Sieving, M.D.   Dg Hip Complete Right  09/23/2011  *RADIOLOGY REPORT*  Clinical Data: Fall with right hip pain.  RIGHT HIP - COMPLETE 2+ VIEW  Comparison: None.  Findings: There is an intertrochanteric fracture of the right femur with varus angulation of the fracture fragments.  Mild degenerative changes in both hips.  No dislocation.  Osteopenia.  Degenerative changes in the visualized portion of the spine.  Vascular calcifications.  IMPRESSION:  1.  Intertrochanteric right femur fracture. Per CMS PQRS reporting requirements (PQRS Measure 24): Given the patient's age of greater than 50 and the fracture site (hip, distal radius, or spine), the patient should be tested for osteoporosis using DXA, and the appropriate treatment considered based on the DXA results. 2.  Osteopenia. 3.  Spondylosis.  Original Report Authenticated By: Reyes Ivan, M.D.   Ct Head Wo Contrast  09/23/2011  *RADIOLOGY REPORT*  Clinical Data:  Larey Seat.  Found down.  CT HEAD WITHOUT CONTRAST CT CERVICAL SPINE WITHOUT CONTRAST  Technique:  Multidetector CT imaging of the head and cervical spine was performed following the standard protocol without intravenous contrast.   Multiplanar CT image reconstructions of the cervical spine were also generated.  Comparison:  None  CT HEAD  Findings: Age related cerebral atrophy, ventriculomegaly and periventricular white matter disease.  Remote appearing lacunar type infarct in the right basal ganglia region and mild ex vacuo dilatation of the frontal horn the right lateral ventricle.  No CT findings for acute hemispheric infarction and/or intracranial hemorrhage.  No extra-axial fluid collections are identified.  The brainstem and cerebellum grossly normal.  The bony structures are intact.  No skull fracture.  The paranasal sinuses and mastoid air cells are clear except for minimal debris in the right frontal sinus.  Osteoporotic changes involving the skull.  IMPRESSION:  1.  Age related cerebral atrophy, ventriculomegaly and periventricular white matter disease. 2.  Remote lacunar type basal ganglia infarcts. 3.  No skull fracture or subdural hematoma.  CT CERVICAL SPINE  Findings: Advanced degenerative cervical spondylosis with disc disease and facet disease.  The overall alignment is maintained. Mild multilevel degenerative subluxations.  The facets are normally aligned.  Advanced facet degenerative changes.  The skull base C1 and C1-2 articulations are maintained.  The dens is intact.  The lung apices are clear.  IMPRESSION:  1.  Advanced degenerative cervical spondylosis with disc disease and facet disease. 2.  No acute cervical spine fracture.  Original Report Authenticated By: P. Loralie Champagne, M.D.   Ct Cervical Spine Wo Contrast  09/23/2011  *RADIOLOGY REPORT*  Clinical Data:  Larey Seat.  Found down.  CT HEAD WITHOUT CONTRAST CT CERVICAL SPINE WITHOUT CONTRAST  Technique:  Multidetector CT imaging of the head and cervical spine was performed following the standard protocol without intravenous contrast.  Multiplanar CT image reconstructions of the cervical spine were also generated.  Comparison:  None  CT HEAD  Findings: Age related  cerebral atrophy, ventriculomegaly and periventricular white matter disease.  Remote appearing lacunar type infarct in the right basal ganglia region and mild ex vacuo dilatation of the frontal horn the right lateral ventricle.  No CT findings for acute hemispheric infarction and/or intracranial hemorrhage.  No extra-axial fluid collections are identified.  The brainstem and cerebellum grossly normal.  The bony structures are intact.  No skull fracture.  The paranasal sinuses and mastoid air cells are clear except for minimal debris in the right frontal sinus.  Osteoporotic changes involving the skull.  IMPRESSION:  1.  Age related cerebral atrophy, ventriculomegaly and periventricular white matter disease. 2.  Remote lacunar type basal ganglia infarcts. 3.  No skull fracture or subdural hematoma.  CT CERVICAL SPINE  Findings: Advanced degenerative cervical spondylosis with disc disease and facet disease.  The overall alignment is maintained. Mild multilevel degenerative subluxations.  The facets are normally aligned.  Advanced facet degenerative changes.  The skull base C1 and C1-2 articulations are maintained.  The dens is intact.  The lung apices are clear.  IMPRESSION:  1.  Advanced degenerative cervical spondylosis with disc disease and facet disease. 2.  No acute cervical spine fracture.  Original Report Authenticated By: P. Loralie Champagne, M.D.    Medications: Scheduled Meds:   . antiseptic oral rinse  15 mL Mouth Rinse q12n4p  . aspirin EC  325 mg Oral Daily  . calcium-vitamin D  1 tablet Oral Daily  .  ceFAZolin (ANCEF) IV  1 g Intravenous On Call  . chlorhexidine   Topical Once  . chlorhexidine  15 mL Mouth Rinse BID  . ciprofloxacin  400 mg Intravenous Q24H  . enoxaparin  30 mg Subcutaneous Q24H  . fentaNYL      . gentamicin irrigation  80 mg Irrigation On Call  . heparin      . hydrALAZINE  25 mg Oral Q8H  . lidocaine      . lisinopril  40 mg Oral Daily  . magnesium sulfate 1 - 4 g  bolus IVPB  2 g Intravenous Once  . methimazole  5 mg Oral Daily  . midazolam      .  morphine injection  2 mg Intravenous Once  . multivitamin  1 tablet Oral Daily  . omega-3 acid ethyl esters  1 g Oral BID  . pyridOXINE  100 mg Oral Daily  . sertraline  50 mg Oral Daily  . simvastatin  20 mg Oral QPM  . DISCONTD: amiodarone (NEXTERONE PREMIX) 360 mg/200 mL dextrose      . DISCONTD: Calcium-Vitamin D  1 tablet Oral Daily  . DISCONTD: enoxaparin  40 mg Subcutaneous Q24H  . DISCONTD: fish oil-omega-3 fatty acids  1 g Oral Daily  . DISCONTD:  morphine injection  4 mg Intravenous Once  . DISCONTD: multivitamin-lutein  1 capsule Oral Daily  . DISCONTD: Ocuvite PreserVision  1 tablet Oral Daily   Continuous Infusions:   . sodium chloride    . sodium chloride 75 mL/hr at 09/23/11 2137  . sodium chloride 10 mL/hr at 09/24/11 0035  . sodium chloride 50 mL/hr at 09/24/11 1315  . nitroGLYCERIN 20 mcg/min (09/24/11 0515)   PRN Meds:.hydrALAZINE, HYDROcodone-acetaminophen, morphine, DISCONTD: 0.9 % irrigation (POUR BTL), DISCONTD: morphine  ECHO: Study Conclusions  - Left ventricle: The cavity size was normal. Wall thickness was normal. Systolic function was normal. The estimated ejection fraction was in the range of 55% to 60%. - Mitral valve: Calcified annulus. Mild to moderate regurgitation. - Left atrium: The atrium was moderately dilated. - Right atrium: The atrium was moderately dilated. - Atrial septum: No defect or patent foramen ovale was identified. - Tricuspid valve: Moderate-severe regurgitation. - Pulmonic valve: Moderate regurgitation. - Pulmonary arteries: PA peak pressure: 67mm Hg (S).   Assessment/Plan: Principal Problem:  *Heart block- complete/ History of cardiac arrest For pacer placement today.   Active Problems:  Hypertension On Nitro drip. Adding IV Hydralazine to further aid in control of BP without affecting Heart rate.    Fracture, intertrochanteric,  right femur Once pacer placed, ortho will schedule her for surgery.   Pulm HTN with Mod to severe TR/ Pulm Regurg    Hyperlipidemia   Tobacco use Tobacco cessation counseling done.    Hyperthyroidism Tapazol    LOS: 1 day   [facility] [phone] 09/24/2011, 4:15 PM

## 2011-09-24 NOTE — Anesthesia Preprocedure Evaluation (Addendum)
Anesthesia Evaluation  Patient identified by MRN, date of birth, ID band Patient awake    Reviewed: Allergy & Precautions, H&P , NPO status , Patient's Chart, lab work & pertinent test results  History of Anesthesia Complications (+) DIFFICULT IV STICK / SPECIAL LINE  Airway  TM Distance: >3 FB     Dental  (+) Edentulous Upper and Edentulous Lower   Pulmonary COPD   Pulmonary exam normal       Cardiovascular hypertension, + dysrhythmias + pacemaker Rhythm:Regular Rate:Normal     Neuro/Psych Depression    GI/Hepatic negative GI ROS, Neg liver ROS,   Endo/Other  Hyperthyroidism   Renal/GU      Musculoskeletal  (+) Arthritis -, Osteoarthritis,    Abdominal Normal abdominal exam  (+)   Peds  Hematology negative hematology ROS (+)   Anesthesia Other Findings   Reproductive/Obstetrics negative OB ROS                          Anesthesia Physical Anesthesia Plan  ASA: IV  Anesthesia Plan: General   Post-op Pain Management:    Induction: Intravenous  Airway Management Planned: Oral ETT  Additional Equipment:   Intra-op Plan:   Post-operative Plan: Extubation in OR  Informed Consent:   Plan Discussed with: CRNA and Anesthesiologist  Anesthesia Plan Comments:         Anesthesia Quick Evaluation

## 2011-09-24 NOTE — H&P (Signed)
Reason for Consult:syncope and heart block  Referring Physician: rothbart  Kathryn [name] is an 78 y.o. female.   HPI: The patient is an 78 yo woman with HTN and heart block who had a syncopal episode and broke her hip. On presentation, she was in [facility] and actually had a PMVT arrest for which she was rescucitated. She is not on any AV nodal blocking drugs and is referred for PPM. No chest pain or sob. She feels weak and c/o her leg hurting.  PMH: Past Medical History  Diagnosis Date  . Hypertension   . Hyperlipemia   . Hyperthyroidism   . Arthritis   . Depression   . Intertrochanteric fracture of right femur 09/22/11    right; S/P fall  . Non-sustained ventricular tachycardia   . Third degree AV block   . Cerebrovascular disease     80 % left ICA  . Macular degeneration     Substantial visual impairment  . Peripheral vascular disease     Femoral bruit; decreased distal pulses    PSHX: Past Surgical History  Procedure Date  . Appendectomy 1943  . Laryngeal nodule ~ 1960's    removed    FAMHX:History reviewed. No pertinent family history.  Social History:  reports that she quit smoking about 3 months ago. Her smoking use included Cigarettes. She has a 66 pack-year smoking history. She has never used smokeless tobacco. She reports that she does not drink alcohol or use illicit drugs.  Allergies: No Known Allergies  Medications: reviewed  Dg Chest 1 View  09/23/2011  *RADIOLOGY REPORT*  Clinical Data: Right femoral neck fracture.  Preoperative respiratory evaluation.  CHEST - 1 VIEW 09/23/2011:  Comparison: None.  Findings: Cardiac silhouette moderately enlarged.  Prominent paracardiac fat pad on the left.  Thoracic aorta atherosclerotic. Hilar and mediastinal contours otherwise unremarkable.  Prominent bronchovascular markings diffusely and moderate central peribronchial thickening.  No localized airspace consolidation.  No evidence of interstitial pulmonary edema.  No  visible pleural effusions.  Multiple left rib fractures, generalized osteopenia, and degenerative changes in the shoulder joints.  IMPRESSION: Moderate cardiomegaly without pulmonary edema.  Moderate changes of bronchitis and/or asthma, likely chronic.  No convincing evidence of acute cardiopulmonary disease.  Original Report Authenticated By: Arnell Sieving, M.D.   Dg Hip Complete Right  09/23/2011  *RADIOLOGY REPORT*  Clinical Data: Fall with right hip pain.  RIGHT HIP - COMPLETE 2+ VIEW  Comparison: None.  Findings: There is an intertrochanteric fracture of the right femur with varus angulation of the fracture fragments.  Mild degenerative changes in both hips.  No dislocation.  Osteopenia.  Degenerative changes in the visualized portion of the spine.  Vascular calcifications.  IMPRESSION:  1.  Intertrochanteric right femur fracture. Per CMS PQRS reporting requirements (PQRS Measure 24): Given the patient's age of greater than 50 and the fracture site (hip, distal radius, or spine), the patient should be tested for osteoporosis using DXA, and the appropriate treatment considered based on the DXA results. 2.  Osteopenia. 3.  Spondylosis.  Original Report Authenticated By: Reyes Ivan, M.D.   Ct Head Wo Contrast  09/23/2011  *RADIOLOGY REPORT*  Clinical Data:  Larey Seat.  Found down.  CT HEAD WITHOUT CONTRAST CT CERVICAL SPINE WITHOUT CONTRAST  Technique:  Multidetector CT imaging of the head and cervical spine was performed following the standard protocol without intravenous contrast.  Multiplanar CT image reconstructions of the cervical spine were also generated.  Comparison:  None  CT  HEAD  Findings: Age related cerebral atrophy, ventriculomegaly and periventricular white matter disease.  Remote appearing lacunar type infarct in the right basal ganglia region and mild ex vacuo dilatation of the frontal horn the right lateral ventricle.  No CT findings for acute hemispheric infarction and/or  intracranial hemorrhage.  No extra-axial fluid collections are identified.  The brainstem and cerebellum grossly normal.  The bony structures are intact.  No skull fracture.  The paranasal sinuses and mastoid air cells are clear except for minimal debris in the right frontal sinus.  Osteoporotic changes involving the skull.  IMPRESSION:  1.  Age related cerebral atrophy, ventriculomegaly and periventricular white matter disease. 2.  Remote lacunar type basal ganglia infarcts. 3.  No skull fracture or subdural hematoma.  CT CERVICAL SPINE  Findings: Advanced degenerative cervical spondylosis with disc disease and facet disease.  The overall alignment is maintained. Mild multilevel degenerative subluxations.  The facets are normally aligned.  Advanced facet degenerative changes.  The skull base C1 and C1-2 articulations are maintained.  The dens is intact.  The lung apices are clear.  IMPRESSION:  1.  Advanced degenerative cervical spondylosis with disc disease and facet disease. 2.  No acute cervical spine fracture.  Original Report Authenticated By: P. Loralie Champagne, M.D.   Ct Cervical Spine Wo Contrast  09/23/2011  *RADIOLOGY REPORT*  Clinical Data:  Larey Seat.  Found down.  CT HEAD WITHOUT CONTRAST CT CERVICAL SPINE WITHOUT CONTRAST  Technique:  Multidetector CT imaging of the head and cervical spine was performed following the standard protocol without intravenous contrast.  Multiplanar CT image reconstructions of the cervical spine were also generated.  Comparison:  None  CT HEAD  Findings: Age related cerebral atrophy, ventriculomegaly and periventricular white matter disease.  Remote appearing lacunar type infarct in the right basal ganglia region and mild ex vacuo dilatation of the frontal horn the right lateral ventricle.  No CT findings for acute hemispheric infarction and/or intracranial hemorrhage.  No extra-axial fluid collections are identified.  The brainstem and cerebellum grossly normal.  The bony  structures are intact.  No skull fracture.  The paranasal sinuses and mastoid air cells are clear except for minimal debris in the right frontal sinus.  Osteoporotic changes involving the skull.  IMPRESSION:  1.  Age related cerebral atrophy, ventriculomegaly and periventricular white matter disease. 2.  Remote lacunar type basal ganglia infarcts. 3.  No skull fracture or subdural hematoma.  CT CERVICAL SPINE  Findings: Advanced degenerative cervical spondylosis with disc disease and facet disease.  The overall alignment is maintained. Mild multilevel degenerative subluxations.  The facets are normally aligned.  Advanced facet degenerative changes.  The skull base C1 and C1-2 articulations are maintained.  The dens is intact.  The lung apices are clear.  IMPRESSION:  1.  Advanced degenerative cervical spondylosis with disc disease and facet disease. 2.  No acute cervical spine fracture.  Original Report Authenticated By: P. Loralie Champagne, M.D.    ROS  As stated in the HPI and negative for all other systems.  Physical Exam  Vitals:Blood pressure 158/72, pulse 46, temperature 98.7 F (37.1 C), temperature source Oral, resp. rate 19, height 5' (1.524 m), weight 43.9 kg (96 lb 12.5 oz), SpO2 96.00%.  elderly appearing NAD HEENT: Unremarkable except blind Neck:  No JVD, no thyromegally Lymphatics:  No adenopathy Back:  No CVA tenderness Lungs:  Clear with no wheezes HEART:  Regular brady rhythm, no murmurs, no rubs, no clicks Abd:  Flat, positive  bowel sounds, no organomegally, no rebound, no guarding Ext:  2 plus pulses, no edema, no cyanosis, no clubbing Skin:  No rashes no nodules Neuro:  CN II through XII intact, motor grossly intact  ECG - NSR with CHB Assessment/Plan: 1. CHB 2. Hip Fx 3. Arthritis Rec: I have discussed the situation with the patient and her grandaughter and the risk/benefits/goals/expectations of PPM have been reviewed and she wishes to proceed.   Sharlot Gowda  TaylorMD 09/24/2011, 12:30 PM

## 2011-09-24 NOTE — Progress Notes (Signed)
  Echocardiogram 2D Echocardiogram has been performed.  Kathryn [name] L 09/24/2011, 11:24 AM

## 2011-09-24 NOTE — Op Note (Signed)
09/23/2011 - 09/24/2011  4:33 PM  PATIENT:   Josem Kaufmann  78 y.o. female  PRE-OPERATIVE DIAGNOSIS:  right intertrochanteric hip fracture  POST-OPERATIVE DIAGNOSIS:  same  PROCEDURE:  ORIF with IMHS  SURGEON:  Tahjai Schetter, Vania Rea. M.D.  ASSISTANTS: Shuford pac   ANESTHESIA:   GET  EBL: 150  SPECIMEN:  none  Drains: none   PATIENT DISPOSITION:  PACU - hemodynamically stable.    PLAN OF CARE: Admit to inpatient  May be weight bearing as tolerated for transfers and gait training Check HGB daily x3 Lovenox as DVT prophylaxis while inpatient I will be out of town next week and my partners and PA will be available for questions/concerns F/U my office 2-3 weeks post op  Dictation# [id number]

## 2011-09-24 NOTE — Progress Notes (Signed)
Kathryn [name]  MRN: [id number] DOB/Age: July 07, 1922 78 y.o. Physician: Lynnea Maizes, M.D.   Procedure(s) (LRB): INTRAMEDULLARY (IM) NAIL INTERTROCHANTRIC (Right)  Subjective: Sitting up in bed appropriate and conversant, worried about upcoming events. Granddaughter at [facility] Vital Signs Temp:  [97.5 F (36.4 C)-98.7 F (37.1 C)] 98.5 F (36.9 C) (03/28 0400) Pulse Rate:  [40-86] 48  (03/28 1000) Resp:  [11-22] 18  (03/28 1000) BP: (166-235)/(39-130) 178/42 mmHg (03/28 1000) SpO2:  [94 %-100 %] 98 % (03/28 1000) Weight:  [43.9 kg (96 lb 12.5 oz)-45.7 kg (100 lb 12 oz)] 43.9 kg (96 lb 12.5 oz) (03/28 0000)  Lab Results  Basename 09/24/11 0525 09/23/11 1310  WBC 11.8* 15.3*  HGB 10.3* 11.8*  HCT 29.4* 34.0*  PLT 171 185   BMET  Basename 09/24/11 0525 09/23/11 1310  NA 131* 130*  K 3.7 4.0  CL 98 95*  CO2 25 24  GLUCOSE 139* 188*  BUN 18 16  CREATININE 0.60 0.50  CALCIUM 9.0 8.8   INR  Date Value Range Status  09/24/2011 1.26  0.00-1.49 (no units) Final     Exam  Thin WF, right hip diffusely tender with pain on attempts at motion  Plan Right Intertrochanteric hip fracture. Anticipate surgery when cleared by cardiology Biventricular pacing pending for this afternoon. Discussed with patient and granddaughter treatment options as well as risks versus benefits thereof. They agree with plan for ORIF and hope to proceed with surgery in next 24hrs. [name] M 09/24/2011, 10:14 AM

## 2011-09-24 NOTE — Op Note (Signed)
DDD PM insertion without immediate complication. J#[id number]

## 2011-09-24 NOTE — Progress Notes (Signed)
Dr Krista Blue aware sbp 170's to 190's, dbp60's to 80's. Pt sleeping and appears comfortable.  Came to PACU on NTG drip at 6 cc (20 mcg/min)  No new orders---OK to tx pt back to 2900.

## 2011-09-24 NOTE — Preoperative (Addendum)
Beta Blockers   Reason not to administer Beta Blockers:Not Applicable 

## 2011-09-24 NOTE — Interval H&P Note (Signed)
History and Physical Interval Note:  09/24/2011 12:37 PM  Kathryn [name]  has presented today for surgery, with the diagnosis of bradicardia  The various methods of treatment have been discussed with the patient and family. After consideration of risks, benefits and other options for treatment, the patient has consented to  Procedure(s) (LRB): PERMANENT PACEMAKER INSERTION (N/A) as a surgical intervention .  The patients' history has been reviewed, patient examined, no change in status, stable for surgery.  I have reviewed the patients' chart and labs.  Questions were answered to the patient's satisfaction.     Lewayne Bunting

## 2011-09-25 ENCOUNTER — Inpatient Hospital Stay (HOSPITAL_COMMUNITY): Payer: Medicare Other

## 2011-09-25 LAB — URINE CULTURE
Colony Count: >=100000
Culture  Setup Time: 201303271530

## 2011-09-25 LAB — CBC
HCT: 25.2 % — ABNORMAL LOW (ref 36.0–46.0)
Hemoglobin: 8.6 g/dL — ABNORMAL LOW (ref 12.0–15.0)
MCH: 30.3 pg (ref 26.0–34.0)
MCV: 88.7 fL (ref 78.0–100.0)
RBC: 2.84 MIL/uL — ABNORMAL LOW (ref 3.87–5.11)
WBC: 10.7 10*3/uL — ABNORMAL HIGH (ref 4.0–10.5)

## 2011-09-25 MED ORDER — CIPROFLOXACIN IN D5W 400 MG/200ML IV SOLN
400.0000 mg | INTRAVENOUS | Status: DC
  Administered 2011-09-25: 400 mg via INTRAVENOUS
  Filled 2011-09-25 (×2): qty 200

## 2011-09-25 MED ORDER — FUROSEMIDE 10 MG/ML IJ SOLN
40.0000 mg | Freq: Two times a day (BID) | INTRAMUSCULAR | Status: DC
  Administered 2011-09-25 – 2011-09-26 (×3): 40 mg via INTRAVENOUS
  Filled 2011-09-25 (×7): qty 4

## 2011-09-25 NOTE — Op Note (Signed)
NAMEJANERA, [name] NO.:  [id number]  MEDICAL RECORD NO.:  [id number]  LOCATION:  2910                         FACILITY:  [facility]  PHYSICIAN:  Vania Rea. Marquis Down, M.D.  DATE OF BIRTH:  1923-01-29  DATE OF PROCEDURE:  09/24/2011 DATE OF DISCHARGE:                              OPERATIVE REPORT   PREOPERATIVE DIAGNOSIS:  Displaced right intertrochanteric hip fracture.  POSTOPERATIVE DIAGNOSIS:  Displaced right intertrochanteric hip fracture.  PROCEDURE:  Open reduction and internal fixation of a right intertrochanteric hip fracture.  SURGEON:  Vania Rea. Olney Monier, MD  ASSISTANT:  Kathryn Lora. Shuford, PA-C  ANESTHESIA:  General endotracheal.  ESTIMATED BLOOD LOSS:  150 mL.  DRAINS:  None.  HISTORY:  Kathryn [name] is an 78 year old female who had a mechanical fall at home yesterday, presenting to the emergency room with complaints of hip pain and inability to bear weight with examination showing foreshortening and external rotation of the right lower extremity with pain, attempts at palpation and motion.  In addition, on presentation, she was found to be bradycardic and had an episode of asystole and subsequently diagnosed with a significant cardiac arrhythmias.  She has been admitted to the Medicine Service on the telemetry unit.  Today, she was taken earlier for the placement of a biventricular pacemaker, which went smoothly and the patient now been stabilized and medically cardiology optimized for planned ORIF of her right intertrochanteric hip fracture.  Preoperatively, I counseled Kathryn [name] as well as her granddaughter who is her healthcare power-of-attorney regarding the current situation, treatment options and the risks versus benefits thereof.  Possible surgical complications were reviewed including the potential for bleeding, infection, neurovascular injury, persistent pain, malunion, nonunion, loss of fixation, and possible need for additional  surgery. They understand and accept and agree with the planned procedure.  PROCEDURE IN DETAIL:  After undergoing routine preop evaluation, the patient had been thoroughly evaluated and had already received a dose of appropriate prophylactic antibiotics within the hour before surgery in relation to her cardiovascular interventional procedure, so we did not proceed with re-dosing, but would rather maintain her on the standard dosing regimen.  Brought to the operating room under her hospital bed and underwent smooth induction of general endotracheal anesthesia. Transferred to the fracture table and appropriately padded and protected.  The right leg was placed in a leg holder and left leg was placed in a well leg holder.  Manipulation point of the right hip and leg was placed into traction, and fluoroscopic imaging was then obtained to confirm proper reduction of the fracture site.  Subsequently achieved and the right hip girdle region was then sterilely prepped and draped in standard fashion.  Time-out was called.  A 3-cm incision was then made just proximal to the tip of the greater trochanter with dissection was carried sharply down through the skin, subcu, and the deep fascia, and this allowed access to the tip of the greater trochanter where restarting awl was then introduced and the guidewire was then passed down the femoral canal with proper positioning, confirmed on both AP and lateral images.  We then passed the reamer over the guidewire to the appropriate depth and then passed the standard 11-mm 130-degree  intramedullary hip screw over the guidewire to the appropriate depth within the femur.  Using the outrigger guide, we then passed the guidepin for the femoral neck and head up into the femoral neck and head with proper positioning confirmed fluoroscopically.  This was measured, drilled, then a 95-mm lag screw was then placed with good positioning, confirmed fluoroscopically.  We  then placed a single static distal locking screw again with the outrigger guide and again fluoroscopic images showed good position of the implant.  Final images were then obtained, which showed good position of the hardware and good position of the fracture site all to our satisfaction.  The wounds were then irrigated, outrigger removed.  Wounds were closed with 2-0 Vicryl of the subcu layer and intracuticular 3-0 Monocryl for the skin followed by Steri-Strips.  Dry dressings were applied about the right hip and the patient was then removed from traction, placed supine, extubated and transferred to the recovery room in stable condition.     Vania Rea. Barbara Ahart, M.D.     KMS/MEDQ  D:  09/24/2011  T:  09/25/2011  Job:  [id number]

## 2011-09-25 NOTE — Progress Notes (Signed)
Clinical Social Work Department BRIEF PSYCHOSOCIAL ASSESSMENT 09/25/2011  Patient:  Kathryn [name], Kathryn [name]     Account Number:  [id number]     Admit date:  09/23/2011  Clinical Social Worker:  Margaree Mackintosh  Date/Time:  09/25/2011 09:30 AM  Referred by:  Physician  Date Referred:  09/25/2011 Referred for  SNF Placement   Other Referral:   Interview type:  Family Other interview type:   Via phone, pt currently confused in hospital setting.    PSYCHOSOCIAL DATA Living Status:  ALONE Admitted from facility:   Level of care:   Primary support name:  Kathryn [name] Primary support relationship to patient:  FAMILY Degree of support available:   Granddaughter.    CURRENT CONCERNS Current Concerns  Post-Acute Placement   Other Concerns:    SOCIAL WORK ASSESSMENT / PLAN Clinical Social Worker recieved referral for SNF placement due to pt's recent fall and subsequent fracture.  CSW reviewed chart and staffed case with RN.  Per RN, pt resides alone and has a family member who stays with pt during the night.  RN also reports pt is currently confused.  CSW phoned pt's grand-dtr, Kathryn [name].  CSW introduced self and explained role.  CSW provided opportunity for grand-dtr to process.  Grand-dtr expressed worry that pt's current living arrangement is not safe due to multiple falls.  CSW reviewed SNF process, grand-dtr to meet with pt shortly and review possible dc options.  CSW to submit pt's information to [facility] in case recommendation is made and pt agreeable to ST-SNF at dc. CSW explained to grand-dtr that due to today being a holiday, bed offers might not be available until Monday.    Appropriate information submitted to [facility].   Assessment/plan status:   Other assessment/ plan:   Information/referral to community resources:    PATIENT'S/FAMILY'S RESPONSE TO PLAN OF CARE:  Family member currently agreeable, CSW to follow up with pt when pt is no longer  confused.       Angelia Mould, MSW, [place] [phone]

## 2011-09-25 NOTE — Progress Notes (Signed)
Subjective: 1 Day Post-Op Procedure(s) (LRB): INTRAMEDULLARY (IM) NAIL INTERTROCHANTRIC (Right) Patient reports pain as mild.   Patient seen in rounds with Dr. Lequita Halt. Patient has complaints of some soreness but feels much better today.  Rough day yesterday after surgery. We will start therapy today.  Objective: Vital signs in last 24 hours: Temp:  [98.1 F (36.7 C)-99.4 F (37.4 C)] 98.3 F (36.8 C) (03/29 0800) Pulse Rate:  [46-99] 81  (03/29 0800) Resp:  [10-22] 18  (03/29 0800) BP: (128-210)/(39-167) 130/56 mmHg (03/29 0800) SpO2:  [90 %-100 %] 98 % (03/29 0800)  Intake/Output from previous day:  Intake/Output Summary (Last 24 hours) at 09/25/11 0947 Last data filed at 09/25/11 0900  Gross per 24 hour  Intake   2304 ml  Output   1035 ml  Net   1269 ml    Intake/Output this shift: Total I/O In: 420 [P.O.:420] Out: 60 [Urine:60]  Labs:  [facility] 09/25/11 0535 09/24/11 0525 09/23/11 1310  HGB 8.6* 10.3* 11.8*    Basename 09/25/11 0535 09/24/11 0525  WBC 10.7* 11.8*  RBC 2.84* 3.29*  HCT 25.2* 29.4*  PLT 142* 171    Basename 09/24/11 0525 09/23/11 1310  NA 131* 130*  K 3.7 4.0  CL 98 95*  CO2 25 24  BUN 18 16  CREATININE 0.60 0.50  GLUCOSE 139* 188*  CALCIUM 9.0 8.8    Basename 09/24/11 0525  LABPT --  INR 1.26    Exam - Neurovascular intact Sensation intact distally Dressing - clean, dry, no drainage Motor function intact - moving foot and toes well on exam.    Past Medical History  Diagnosis Date  . Hypertension   . Hyperlipemia   . Hyperthyroidism   . Arthritis   . Depression   . Intertrochanteric fracture of right femur 09/22/11    right; S/P fall  . Non-sustained ventricular tachycardia   . Third degree AV block   . Cerebrovascular disease     80 % left ICA  . Macular degeneration     Substantial visual impairment  . Peripheral vascular disease     Femoral bruit; decreased distal pulses    Assessment/Plan: 1 Day Post-Op  Procedure(s) (LRB): INTRAMEDULLARY (IM) NAIL INTERTROCHANTRIC (Right) Principal Problem:  *Heart block Active Problems:  Hypertension  Fracture, intertrochanteric, right femur  Hyperlipidemia  Tobacco use  Hyperthyroidism  History of cardiac arrest   Up with therapy Weight Bearing As Tolerated right Leg Begin Therapy when [facility] from medical standpoint  Patrica Duel 09/25/2011, 9:47 AM

## 2011-09-25 NOTE — Evaluation (Signed)
Physical Therapy Evaluation Patient Details Name: Kathryn [name] MRN: [id number] DOB: [birth year] Today's Date: 09/25/2011  Problem List:  Patient Active Problem List  Diagnoses  . Hypertension  . Fracture, intertrochanteric, right femur  . Hyperlipidemia  . Tobacco use  . Hyperthyroidism  . Non-sustained ventricular tachycardia  . Third degree AV block  . Cerebrovascular disease  . Peripheral vascular disease  . Heart block  . History of cardiac arrest    Past Medical History:  Past Medical History  Diagnosis Date  . Hypertension   . Hyperlipemia   . Hyperthyroidism   . Arthritis   . Depression   . Intertrochanteric fracture of right femur 09/22/11    right; S/P fall  . Non-sustained ventricular tachycardia   . Third degree AV block   . Cerebrovascular disease     80 % left ICA  . Macular degeneration     Substantial visual impairment  . Peripheral vascular disease     Femoral bruit; decreased distal pulses   Past Surgical History:  Past Surgical History  Procedure Date  . Appendectomy 1943  . Laryngeal nodule ~ 1960's    removed    PT Assessment/Plan/Recommendation PT Assessment Clinical Impression Statement: pt is an 78 y/o female adm with complete HB and fractured R femur s/p ORIF.  Mobility is limited by pain and weakness.  Pt is resistant to cuing and recommendations.  Pt can benefit from SNF  for rehab PT Recommendation/Assessment: Patient will need skilled PT in the acute care venue PT Problem List: Decreased strength;Decreased activity tolerance;Decreased balance;Decreased knowledge of use of DME;Pain;Decreased mobility Barriers to Discharge: Decreased caregiver support PT Therapy Diagnosis : Acute pain;Generalized weakness PT Plan PT Frequency: Min 3X/week PT Treatment/Interventions: Gait training;DME instruction;Functional mobility training;Therapeutic activities;Therapeutic exercise;Patient/family education PT Recommendation Follow Up  Recommendations: Skilled nursing facility Equipment Recommended: Defer to next venue PT Goals  Acute Rehab PT Goals PT Goal Formulation: With patient Time For Goal Achievement: 2 weeks Pt will go Supine/Side to Sit: with supervision PT Goal: Supine/Side to Sit - Progress: Goal set today Pt will go Sit to Stand: with supervision PT Goal: Sit to Stand - Progress: Goal set today Pt will Transfer Bed to Chair/Chair to Bed: with supervision PT Transfer Goal: Bed to Chair/Chair to Bed - Progress: Goal set today Pt will Ambulate: 51 - 150 feet;with supervision;with least restrictive assistive device PT Goal: Ambulate - Progress: Goal set today  PT Evaluation Precautions/Restrictions  Precautions Precautions: ICD/Pacemaker;Fall Required Braces or Orthoses: No Restrictions Weight Bearing Restrictions: No RLE Weight Bearing: Weight bearing as tolerated Other Position/Activity Restrictions: WBAT Prior Functioning  Home Living Lives With: Spouse Type of Home: House Home Layout: Two level;One level Home Access: Stairs to enter Entrance Stairs-Rails: Left Entrance Stairs-Number of Steps: 3 steps up from the den. Bathroom Shower/Tub: Associate Professor: Yes Home Adaptive Equipment: Shower chair with back;Grab bars around toilet;Straight cane Prior Function Level of Independence: Independent with transfers;Independent with basic ADLs Cognition Cognition Arousal/Alertness: Awake/alert Overall Cognitive Status: Impaired Memory: Appears impaired Memory Deficits: Pt with decreased awareness of situation and that she will need a RW.  Pt insists that she used a cane at home and would do better with it.   Orientation Level: Oriented to person Safety/Judgement: Decreased safety judgement for tasks assessed Decreased Safety/Judgement: Decreased awareness of need for assistance Awareness of Deficits: Decreased awareness of  deficits Sensation/Coordination Sensation Light Touch: Appears Intact Stereognosis: Not tested Hot/Cold: Not tested Proprioception: Not tested Coordination  Gross Motor Movements are Fluid and Coordinated: Yes Fine Motor Movements are Fluid and Coordinated: No Coordination and Movement Description: Pt reports decreased ability to perfrom buttoning and tying scondary to decreased vision.  Slight UE tremor noted but minor. Extremity Assessment RUE Assessment RUE Assessment: Exceptions to [facility] (strength 3+/5 throughtout.) RUE Strength RUE Overall Strength: Deficits LUE Assessment LUE Assessment: Exceptions to [facility] LUE Strength LUE Overall Strength Comments: Strength 3+/5 throughout. RLE Assessment RLE Assessment: Exceptions to [facility] RLE Strength RLE Overall Strength: Deficits;Due to pain RLE Overall Strength Comments: grossly 3/5 LLE Assessment LLE Assessment: Within Functional Limits Mobility (including Balance) Bed Mobility Bed Mobility: Yes Supine to Sit: HOB flat;3: Mod assist Supine to Sit Details (indicate cue type and reason): vc for technique, truncal assist to come forward Sitting - Scoot to Edge of Bed: 3: Mod assist Sit to Supine: 1: +2 Total assist;HOB flat Sit to Supine - Details (indicate cue type and reason): Pt 25 % Transfers Transfers: Yes Sit to Stand: 3: Mod assist;With upper extremity assist;From elevated surface;From bed Sit to Stand Details (indicate cue type and reason): Max demonstrational cueing for hand placement. Stand to Sit: 4: Min assist;To chair/3-in-1 Stand to Sit Details: vc's to reinforce safety/hand placement  Ambulation/Gait Ambulation/Gait: Yes Ambulation/Gait Assistance: 3: Mod assist Ambulation/Gait Assistance Details (indicate cue type and reason): gait characterized by flexed posture, step to gait that was uncoordinated and very variable with poor/unsafe use of the RW.  Extensive cuing needed to help control all of pt's movement, but pt  agitated with the necessarty assist Ambulation Distance (Feet): 20 Feet Assistive device: Rolling walker Gait Pattern: Step-to pattern;Decreased step length - right;Decreased step length - left;Decreased stride length;Trunk flexed;Decreased weight shift to right Stairs: No Wheelchair Mobility Wheelchair Mobility: No  Posture/Postural Control Posture/Postural Control: No significant limitations Balance Balance Assessed: No Exercise    End of Session PT - End of Session Activity Tolerance: Patient limited by pain Patient left: in bed;with call bell in reach;with family/visitor present Nurse Communication: Mobility status for transfers General Behavior During Session: [facility] for tasks performed Cognition: Impaired  Delight Bickle, Eliseo Gum 09/25/2011, 5:07 PM  09/25/2011  [place] Bing, PT [phone] (pager)

## 2011-09-25 NOTE — Evaluation (Signed)
Occupational Therapy Evaluation Patient Details Name: Kathryn [name] MRN: [id number] DOB: 1923/02/17 Today's Date: 09/25/2011 14:56-15:38  evII Problem List:  Patient Active Problem List  Diagnoses  . Hypertension  . Fracture, intertrochanteric, right femur  . Hyperlipidemia  . Tobacco use  . Hyperthyroidism  . Non-sustained ventricular tachycardia  . Third degree AV block  . Cerebrovascular disease  . Peripheral vascular disease  . Heart block  . History of cardiac arrest    Past Medical History:  Past Medical History  Diagnosis Date  . Hypertension   . Hyperlipemia   . Hyperthyroidism   . Arthritis   . Depression   . Intertrochanteric fracture of right femur 09/22/11    right; S/P fall  . Non-sustained ventricular tachycardia   . Third degree AV block   . Cerebrovascular disease     80 % left ICA  . Macular degeneration     Substantial visual impairment  . Peripheral vascular disease     Femoral bruit; decreased distal pulses   Past Surgical History:  Past Surgical History  Procedure Date  . Appendectomy 1943  . Laryngeal nodule ~ 1960's    removed    OT Assessment/Plan/Recommendation OT Assessment Clinical Impression Statement: 78 yr old female admitted after sustaining a fall resulting in a right hip fracture.  Pt underwent IM nailing of the femur and now presents with decreased overall independence with basic selfcare tasks.  Feel pt will need extensive SNF rehab at discharge secondary to not having 24 hour supervision at discharge. OT Recommendation/Assessment: Patient will need skilled OT in the acute care venue OT Problem List: Decreased strength;Pain;Decreased cognition;Impaired balance (sitting and/or standing);Impaired vision/perception Barriers to Discharge: Decreased caregiver support OT Therapy Diagnosis : Generalized weakness;Acute pain OT Plan OT Frequency: Min 1X/week OT Treatment/Interventions: Self-care/ADL training;Therapeutic  activities;Therapeutic exercise;DME and/or AE instruction;Balance training;Patient/family education OT Recommendation Follow Up Recommendations: Skilled nursing facility Equipment Recommended: Defer to next venue Individuals Consulted Consulted and Agree with Results and Recommendations: Patient unable/family or caregiver not available OT Goals Acute Rehab OT Goals OT Goal Formulation: With patient Time For Goal Achievement: 2 weeks ADL Goals Pt Will Perform Grooming: with min assist;Standing at sink ADL Goal: Grooming - Progress: Goal set today Pt Will Perform Lower Body Bathing: with min assist;Sit to stand from bed ADL Goal: Lower Body Bathing - Progress: Goal set today Pt Will Transfer to Toilet: with min assist;with DME;3-in-1 ADL Goal: Toilet Transfer - Progress: Goal set today Pt Will Perform Toileting - Clothing Manipulation: with min assist;Sitting on 3-in-1 or toilet;Standing ADL Goal: Toileting - Clothing Manipulation - Progress: Goal set today Pt Will Perform Toileting - Hygiene: with min assist;Sit to stand from 3-in-1/toilet ADL Goal: Toileting - Hygiene - Progress: Goal set today  OT Evaluation Precautions/Restrictions  Precautions Precautions: ICD/Pacemaker;Fall Required Braces or Orthoses: No Restrictions Weight Bearing Restrictions: No Other Position/Activity Restrictions: WBAT Prior Functioning Home Living Lives With: Spouse Type of Home: House Home Layout: Two level;One level Home Access: Stairs to enter Entrance Stairs-Rails: Left Entrance Stairs-Number of Steps: 3 steps up from the den. Bathroom Shower/Tub: Associate Professor: Yes Home Adaptive Equipment: Shower chair with back;Grab bars around toilet;Straight cane Prior Function Level of Independence: Independent with transfers;Independent with basic ADLs ADL ADL Eating/Feeding: Simulated;Set up Where Assessed - Eating/Feeding: Bed level Grooming:  Performed;Brushing hair;Supervision/safety Where Assessed - Grooming: Sitting, bed;Unsupported Upper Body Bathing: Simulated;Set up Where Assessed - Upper Body Bathing: Unsupported;Sitting, bed Lower Body Bathing: Moderate assistance Where Assessed -  Lower Body Bathing: Sit to stand from bed Upper Body Dressing: Simulated;Set up Where Assessed - Upper Body Dressing: Sitting, bed;Unsupported Lower Body Dressing: Performed;Moderate assistance Where Assessed - Lower Body Dressing: Sit to stand from bed Toilet Transfer: Simulated;Moderate assistance Toilet Transfer Method: Proofreader: Bedside commode Toileting - Clothing Manipulation: Simulated;Moderate assistance Where Assessed - Toileting Clothing Manipulation: Sit to stand from 3-in-1 or toilet Toileting - Hygiene: Simulated;Moderate assistance Where Assessed - Toileting Hygiene: Sit to stand from 3-in-1 or toilet Tub/Shower Transfer: Not assessed Tub/Shower Transfer Method: Not assessed Equipment Used: Rolling walker Ambulation Related to ADLs: Pt needed max demonstrational cueing and mod assist to advance RW and perform correct gait sequence.  Pt tends to get outside of the walker and push it too far out in front of her. ADL Comments: Pt unable to reach the RLE for dressing tasks.  Has macular degeneration so may not want to try   AE secondary to visual impairments. Vision/Perception  Vision - History Baseline Vision: Legally blind Visual History: Macular degeneration Patient Visual Report: No change from baseline Vision - Assessment Vision Assessment: Vision not tested Perception Perception: Within Functional Limits Praxis Praxis: Intact Cognition Cognition Arousal/Alertness: Awake/alert Overall Cognitive Status: Impaired Memory: Appears impaired Memory Deficits: Pt with decreased awareness of situation and that she will need a RW.  Pt insists that she used a cane at home and would do better with it.    Orientation Level: Oriented to person Safety/Judgement: Decreased safety judgement for tasks assessed Decreased Safety/Judgement: Decreased awareness of need for assistance Awareness of Deficits: Decreased awareness of deficits Sensation/Coordination Sensation Light Touch: Appears Intact Stereognosis: Not tested Hot/Cold: Not tested Proprioception: Not tested Coordination Gross Motor Movements are Fluid and Coordinated: Yes Fine Motor Movements are Fluid and Coordinated: No Coordination and Movement Description: Pt reports decreased ability to perfrom buttoning and tying scondary to decreased vision.  Slight UE tremor noted but minor. Extremity Assessment RUE Assessment RUE Assessment: Exceptions to [facility] (strength 3+/5 throughtout.) RUE Strength RUE Overall Strength: Deficits LUE Assessment LUE Assessment: Exceptions to [facility] LUE Strength LUE Overall Strength Comments: Strength 3+/5 throughout. Mobility  Bed Mobility Bed Mobility: Yes Supine to Sit: HOB flat;3: Mod assist Sitting - Scoot to Edge of Bed: 3: Mod assist Sit to Supine: 1: +2 Total assist;HOB flat Sit to Supine - Details (indicate cue type and reason): Pt 25 % Transfers Transfers: Yes Sit to Stand: 3: Mod assist;With upper extremity assist;From elevated surface;From bed Sit to Stand Details (indicate cue type and reason): Max demonstrational cueing for hand placement.  End of Session OT - End of Session Activity Tolerance: Patient limited by pain Patient left: in bed;with call bell in reach;with bed alarm set General Behavior During Session: [facility] for tasks performed Cognition: Impaired   Jackelyne Sayer OTR/L 09/25/2011, 4:34 PM  Pager number [phone]

## 2011-09-25 NOTE — Progress Notes (Signed)
Subjective: Patient denies CP, SOB. Objective: Filed Vitals:   09/25/11 0400 09/25/11 0500 09/25/11 0600 09/25/11 0800  BP: 139/39 149/43 128/93 130/56  Pulse: 75 74  81  Temp: 99.3 F (37.4 C)   98.3 F (36.8 C)  TempSrc: Oral   Oral  Resp: 16 17 15 18   Height:      Weight:      SpO2: 96% 90% 97% 98%   Weight change:   Intake/Output Summary (Last 24 hours) at 09/25/11 0914 Last data filed at 09/25/11 0900  Gross per 24 hour  Intake   2304 ml  Output   1035 ml  Net   1269 ml    General: Alert, awake, oriented x3, in no acute distress Neck:  JVP is normal Heart: Regular rate and rhythm, without murmurs, rubs, gallops.  Chest :  Pacer site with bandage.  Dry. Lungs: Clear to auscultation.  No rales or wheezes. Exemities:  No edema.   Neuro: Grossly intact, nonfocal.  Tele:  Intermittently paced.  Lab Results: Results for orders placed during the hospital encounter of 09/23/11 (from the past 24 hour(s))  SURGICAL PCR SCREEN     Status: Normal   Collection Time   09/24/11 12:19 PM      Component Value Range   MRSA, PCR NEGATIVE  NEGATIVE    Staphylococcus aureus NEGATIVE  NEGATIVE   CBC     Status: Abnormal   Collection Time   09/25/11  5:35 AM      Component Value Range   WBC 10.7 (*) 4.0 - 10.5 (K/uL)   RBC 2.84 (*) 3.87 - 5.11 (MIL/uL)   Hemoglobin 8.6 (*) 12.0 - 15.0 (g/dL)   HCT 96.0 (*) 45.4 - 46.0 (%)   MCV 88.7  78.0 - 100.0 (fL)   MCH 30.3  26.0 - 34.0 (pg)   MCHC 34.1  30.0 - 36.0 (g/dL)   RDW 09.8 (*) 11.9 - 15.5 (%)   Platelets 142 (*) 150 - 400 (K/uL)    Studies/Results: Dg Chest 1 View  09/23/2011  *RADIOLOGY REPORT*  Clinical Data: Right femoral neck fracture.  Preoperative respiratory evaluation.  CHEST - 1 VIEW 09/23/2011:  Comparison: None.  Findings: Cardiac silhouette moderately enlarged.  Prominent paracardiac fat pad on the left.  Thoracic aorta atherosclerotic. Hilar and mediastinal contours otherwise unremarkable.  Prominent  bronchovascular markings diffusely and moderate central peribronchial thickening.  No localized airspace consolidation.  No evidence of interstitial pulmonary edema.  No visible pleural effusions.  Multiple left rib fractures, generalized osteopenia, and degenerative changes in the shoulder joints.  IMPRESSION: Moderate cardiomegaly without pulmonary edema.  Moderate changes of bronchitis and/or asthma, likely chronic.  No convincing evidence of acute cardiopulmonary disease.  Original Report Authenticated By: Arnell Sieving, M.D.   Dg Hip Complete Right  09/23/2011  *RADIOLOGY REPORT*  Clinical Data: Fall with right hip pain.  RIGHT HIP - COMPLETE 2+ VIEW  Comparison: None.  Findings: There is an intertrochanteric fracture of the right femur with varus angulation of the fracture fragments.  Mild degenerative changes in both hips.  No dislocation.  Osteopenia.  Degenerative changes in the visualized portion of the spine.  Vascular calcifications.  IMPRESSION:  1.  Intertrochanteric right femur fracture. Per CMS PQRS reporting requirements (PQRS Measure 24): Given the patient's age of greater than 50 and the fracture site (hip, distal radius, or spine), the patient should be tested for osteoporosis using DXA, and the appropriate treatment considered based on the DXA results.  2.  Osteopenia. 3.  Spondylosis.  Original Report Authenticated By: Reyes Ivan, M.D.   Dg Hip Operative Right  09/24/2011  *RADIOLOGY REPORT*  Clinical Data: Fracture fixation.  OPERATIVE RIGHT HIP  Comparison: Plain films right hip 09/23/2011.  Findings: We are provided with four fluoroscopic spot views of the right hip.  Images demonstrate a dynamic hip screw and short IM nail with a single distal interlocking screw for fixation of an intertrochanteric fracture.  Position and alignment appear near anatomic.  No new abnormality.  IMPRESSION: ORIF right intertrochanteric fracture.  Original Report Authenticated By: Bernadene Bell.  Maricela Curet, M.D.   Ct Head Wo Contrast  09/23/2011  *RADIOLOGY REPORT*  Clinical Data:  Larey Seat.  Found down.  CT HEAD WITHOUT CONTRAST CT CERVICAL SPINE WITHOUT CONTRAST  Technique:  Multidetector CT imaging of the head and cervical spine was performed following the standard protocol without intravenous contrast.  Multiplanar CT image reconstructions of the cervical spine were also generated.  Comparison:  None  CT HEAD  Findings: Age related cerebral atrophy, ventriculomegaly and periventricular white matter disease.  Remote appearing lacunar type infarct in the right basal ganglia region and mild ex vacuo dilatation of the frontal horn the right lateral ventricle.  No CT findings for acute hemispheric infarction and/or intracranial hemorrhage.  No extra-axial fluid collections are identified.  The brainstem and cerebellum grossly normal.  The bony structures are intact.  No skull fracture.  The paranasal sinuses and mastoid air cells are clear except for minimal debris in the right frontal sinus.  Osteoporotic changes involving the skull.  IMPRESSION:  1.  Age related cerebral atrophy, ventriculomegaly and periventricular white matter disease. 2.  Remote lacunar type basal ganglia infarcts. 3.  No skull fracture or subdural hematoma.  CT CERVICAL SPINE  Findings: Advanced degenerative cervical spondylosis with disc disease and facet disease.  The overall alignment is maintained. Mild multilevel degenerative subluxations.  The facets are normally aligned.  Advanced facet degenerative changes.  The skull base C1 and C1-2 articulations are maintained.  The dens is intact.  The lung apices are clear.  IMPRESSION:  1.  Advanced degenerative cervical spondylosis with disc disease and facet disease. 2.  No acute cervical spine fracture.  Original Report Authenticated By: P. Loralie Champagne, M.D.   Ct Cervical Spine Wo Contrast  09/23/2011  *RADIOLOGY REPORT*  Clinical Data:  Larey Seat.  Found down.  CT HEAD WITHOUT CONTRAST  CT CERVICAL SPINE WITHOUT CONTRAST  Technique:  Multidetector CT imaging of the head and cervical spine was performed following the standard protocol without intravenous contrast.  Multiplanar CT image reconstructions of the cervical spine were also generated.  Comparison:  None  CT HEAD  Findings: Age related cerebral atrophy, ventriculomegaly and periventricular white matter disease.  Remote appearing lacunar type infarct in the right basal ganglia region and mild ex vacuo dilatation of the frontal horn the right lateral ventricle.  No CT findings for acute hemispheric infarction and/or intracranial hemorrhage.  No extra-axial fluid collections are identified.  The brainstem and cerebellum grossly normal.  The bony structures are intact.  No skull fracture.  The paranasal sinuses and mastoid air cells are clear except for minimal debris in the right frontal sinus.  Osteoporotic changes involving the skull.  IMPRESSION:  1.  Age related cerebral atrophy, ventriculomegaly and periventricular white matter disease. 2.  Remote lacunar type basal ganglia infarcts. 3.  No skull fracture or subdural hematoma.  CT CERVICAL SPINE  Findings: Advanced degenerative cervical  spondylosis with disc disease and facet disease.  The overall alignment is maintained. Mild multilevel degenerative subluxations.  The facets are normally aligned.  Advanced facet degenerative changes.  The skull base C1 and C1-2 articulations are maintained.  The dens is intact.  The lung apices are clear.  IMPRESSION:  1.  Advanced degenerative cervical spondylosis with disc disease and facet disease. 2.  No acute cervical spine fracture.  Original Report Authenticated By: P. Loralie Champagne, M.D.   Dg Chest Portable 1 View  09/25/2011  *RADIOLOGY REPORT*  Clinical Data: Postop pacemaker placement.  PORTABLE CHEST - 1 VIEW  Comparison: 09/23/2011  Findings: Patient is rotated towards the left side.  There is now a dual lead left cardiac pacemaker.  The  ventricular lead is not completely imaged.  There are new basilar densities which may represent dependent edema and/or effusions.  No evidence for a large pneumothorax.  The heart size appears to be grossly stable.  IMPRESSION: Placement of a left cardiac pacemaker as described.  No evidence for a pneumothorax  Increased basilar densities may represent dependent edema and small effusions.  Original Report Authenticated By: Richarda Overlie, M.D.    Medications: I have reviewed the patient's current medications.   Patient Active Hospital Problem List: Heart block (09/23/2011)   Assessment: s/p DDD PM.  Will need to take care not to move L arm much over next couple weeks.  Will provide instructions to patient,  Esp important with her doing more with hip rehab.  Will make sure she has f/u in clinic.   Hypertension (09/23/2011)   Assessment: Adequate control   Fracture, intertrochanteric, right femur (09/23/2011)   Assessment: s/p surgery.  Hyperlipidemia (09/23/2011)   Assessment: continue Zocor.    LOS: 2 days   Dietrich Pates 09/25/2011, 9:14 AM

## 2011-09-25 NOTE — Progress Notes (Addendum)
[facility]  Interim history: 78 y/o female admitted after a fall and fracture of right hip and found to have 3rd degree heart block and uncontrolled BP. Bp was initially controlled with Nitro drip, PO and PRN IV Hydralazine. She underwent Pacer placement and ORIF on 3/28.   Subjective: No complaints other than not liking her food.   Objective: Blood pressure 145/49, pulse 91, temperature 99.9 F (37.7 C), temperature source Oral, resp. rate 17, height 5' (1.524 m), weight 43.9 kg (96 lb 12.5 oz), SpO2 98.00%. Weight change:   Intake/Output Summary (Last 24 hours) at 09/25/11 1358 Last data filed at 09/25/11 1342  Gross per 24 hour  Intake   2126 ml  Output   1625 ml  Net    501 ml    Physical Exam: Constitutional: Vital signs reviewed. Patient is an elderly female in no acute distress and cooperative with exam. Alert and oriented x3.  Head: Normocephalic and atraumatic  Ear: TM normal bilaterally  Mouth: no erythema or exudates, MMM  Eyes: PERRL, EOMI, conjunctivae normal, No scleral icterus.  Cardiovascular: RRR, S1 normal, S2 normal, no MRG, pulses symmetric and intact bilaterally  Pulmonary/Chest: CTAB, no wheezes, rales, or rhonchi  Abdominal: Soft. Non-tender, non-distended, bowel sounds are normal, no masses, organomegaly, or guarding present.  GU: no CVA tenderness Musculoskeletal:rt leg externally rotated, painful on movement and ROM, No joint deformities, erythema, or stiffness,  Ext: no edema and no cyanosis, pulses palpable bilaterally (DP and PT)  Neurological: A&O x3, Strenght is normal and symmetric bilaterally, cranial nerve II-XII are grossly intact, no focal motor deficit, sensory intact to light touch bilaterally.    Lab Results:  Basename 09/24/11 0525 09/23/11 1907 09/23/11 1310  NA 131* -- 130*  K 3.7 -- 4.0  CL 98 -- 95*  CO2 25 -- 24  GLUCOSE 139* -- 188*  BUN 18 -- 16  CREATININE 0.60 -- 0.50  CALCIUM 9.0 -- 8.8  MG 2.3 1.6 --  PHOS --  -- --   No results found for this basename: AST:2,ALT:2,ALKPHOS:2,BILITOT:2,PROT:2,ALBUMIN:2 in the last 72 hours No results found for this basename: LIPASE:2,AMYLASE:2 in the last 72 hours  Basename 09/25/11 0535 09/24/11 0525 09/23/11 1310  WBC 10.7* 11.8* --  NEUTROABS -- -- 14.0*  HGB 8.6* 10.3* --  HCT 25.2* 29.4* --  MCV 88.7 89.4 --  PLT 142* 171 --    Basename 09/24/11 0525 09/23/11 1907 09/23/11 1300  CKTOTAL 95 -- 64  CKMB 3.9 -- 3.7  CKMBINDEX -- -- --  TROPONINI <0.30 <0.30 <0.30   No components found with this basename: POCBNP:3 No results found for this basename: DDIMER:2 in the last 72 hours No results found for this basename: HGBA1C:2 in the last 72 hours No results found for this basename: CHOL:2,HDL:2,LDLCALC:2,TRIG:2,CHOLHDL:2,LDLDIRECT:2 in the last 72 hours  Basename 09/23/11 1907  TSH 1.732  T4TOTAL --  T3FREE --  THYROIDAB --   No results found for this basename: VITAMINB12:2,FOLATE:2,FERRITIN:2,TIBC:2,IRON:2,RETICCTPCT:2 in the last 72 hours  Micro Results: Recent Results (from the past 240 hour(s))  URINE CULTURE     Status: Normal (Preliminary result)   Collection Time   09/23/11 12:42 PM      Component Value Range Status Comment   Specimen Description URINE, CLEAN CATCH   Final    Special Requests ADDED ON [id number] @1508    Final    Culture  Setup Time [id number]   Final    Colony Count >=100,000 COLONIES/ML   Final  Culture GRAM NEGATIVE RODS   Final    Report Status PENDING   Incomplete   MRSA PCR SCREENING     Status: Normal   Collection Time   09/23/11  8:49 PM      Component Value Range Status Comment   MRSA by PCR NEGATIVE  NEGATIVE  Final   SURGICAL PCR SCREEN     Status: Normal   Collection Time   09/24/11 12:19 PM      Component Value Range Status Comment   MRSA, PCR NEGATIVE  NEGATIVE  Final    Staphylococcus aureus NEGATIVE  NEGATIVE  Final     Studies/Results: Dg Chest 1 View  09/23/2011  *RADIOLOGY REPORT*  Clinical  Data: Right femoral neck fracture.  Preoperative respiratory evaluation.  CHEST - 1 VIEW 09/23/2011:  Comparison: None.  Findings: Cardiac silhouette moderately enlarged.  Prominent paracardiac fat pad on the left.  Thoracic aorta atherosclerotic. Hilar and mediastinal contours otherwise unremarkable.  Prominent bronchovascular markings diffusely and moderate central peribronchial thickening.  No localized airspace consolidation.  No evidence of interstitial pulmonary edema.  No visible pleural effusions.  Multiple left rib fractures, generalized osteopenia, and degenerative changes in the shoulder joints.  IMPRESSION: Moderate cardiomegaly without pulmonary edema.  Moderate changes of bronchitis and/or asthma, likely chronic.  No convincing evidence of acute cardiopulmonary disease.  Original Report Authenticated By: Arnell Sieving, M.D.   Dg Hip Complete Right  09/23/2011  *RADIOLOGY REPORT*  Clinical Data: Fall with right hip pain.  RIGHT HIP - COMPLETE 2+ VIEW  Comparison: None.  Findings: There is an intertrochanteric fracture of the right femur with varus angulation of the fracture fragments.  Mild degenerative changes in both hips.  No dislocation.  Osteopenia.  Degenerative changes in the visualized portion of the spine.  Vascular calcifications.  IMPRESSION:  1.  Intertrochanteric right femur fracture. Per CMS PQRS reporting requirements (PQRS Measure 24): Given the patient's age of greater than 50 and the fracture site (hip, distal radius, or spine), the patient should be tested for osteoporosis using DXA, and the appropriate treatment considered based on the DXA results. 2.  Osteopenia. 3.  Spondylosis.  Original Report Authenticated By: Reyes Ivan, M.D.   Ct Head Wo Contrast  09/23/2011  *RADIOLOGY REPORT*  Clinical Data:  Larey Seat.  Found down.  CT HEAD WITHOUT CONTRAST CT CERVICAL SPINE WITHOUT CONTRAST  Technique:  Multidetector CT imaging of the head and cervical spine was performed  following the standard protocol without intravenous contrast.  Multiplanar CT image reconstructions of the cervical spine were also generated.  Comparison:  None  CT HEAD  Findings: Age related cerebral atrophy, ventriculomegaly and periventricular white matter disease.  Remote appearing lacunar type infarct in the right basal ganglia region and mild ex vacuo dilatation of the frontal horn the right lateral ventricle.  No CT findings for acute hemispheric infarction and/or intracranial hemorrhage.  No extra-axial fluid collections are identified.  The brainstem and cerebellum grossly normal.  The bony structures are intact.  No skull fracture.  The paranasal sinuses and mastoid air cells are clear except for minimal debris in the right frontal sinus.  Osteoporotic changes involving the skull.  IMPRESSION:  1.  Age related cerebral atrophy, ventriculomegaly and periventricular white matter disease. 2.  Remote lacunar type basal ganglia infarcts. 3.  No skull fracture or subdural hematoma.  CT CERVICAL SPINE  Findings: Advanced degenerative cervical spondylosis with disc disease and facet disease.  The overall alignment is maintained. Mild  multilevel degenerative subluxations.  The facets are normally aligned.  Advanced facet degenerative changes.  The skull base C1 and C1-2 articulations are maintained.  The dens is intact.  The lung apices are clear.  IMPRESSION:  1.  Advanced degenerative cervical spondylosis with disc disease and facet disease. 2.  No acute cervical spine fracture.  Original Report Authenticated By: P. Loralie Champagne, M.D.   Ct Cervical Spine Wo Contrast  09/23/2011  *RADIOLOGY REPORT*  Clinical Data:  Larey Seat.  Found down.  CT HEAD WITHOUT CONTRAST CT CERVICAL SPINE WITHOUT CONTRAST  Technique:  Multidetector CT imaging of the head and cervical spine was performed following the standard protocol without intravenous contrast.  Multiplanar CT image reconstructions of the cervical spine were also  generated.  Comparison:  None  CT HEAD  Findings: Age related cerebral atrophy, ventriculomegaly and periventricular white matter disease.  Remote appearing lacunar type infarct in the right basal ganglia region and mild ex vacuo dilatation of the frontal horn the right lateral ventricle.  No CT findings for acute hemispheric infarction and/or intracranial hemorrhage.  No extra-axial fluid collections are identified.  The brainstem and cerebellum grossly normal.  The bony structures are intact.  No skull fracture.  The paranasal sinuses and mastoid air cells are clear except for minimal debris in the right frontal sinus.  Osteoporotic changes involving the skull.  IMPRESSION:  1.  Age related cerebral atrophy, ventriculomegaly and periventricular white matter disease. 2.  Remote lacunar type basal ganglia infarcts. 3.  No skull fracture or subdural hematoma.  CT CERVICAL SPINE  Findings: Advanced degenerative cervical spondylosis with disc disease and facet disease.  The overall alignment is maintained. Mild multilevel degenerative subluxations.  The facets are normally aligned.  Advanced facet degenerative changes.  The skull base C1 and C1-2 articulations are maintained.  The dens is intact.  The lung apices are clear.  IMPRESSION:  1.  Advanced degenerative cervical spondylosis with disc disease and facet disease. 2.  No acute cervical spine fracture.  Original Report Authenticated By: P. Loralie Champagne, M.D.    Medications: Scheduled Meds:    . antiseptic oral rinse  15 mL Mouth Rinse q12n4p  . aspirin EC  325 mg Oral Daily  . calcium-vitamin D  1 tablet Oral Daily  .  ceFAZolin (ANCEF) IV  1 g Intravenous On Call  .  ceFAZolin (ANCEF) IV  1 g Intravenous Q6H  . enoxaparin  30 mg Subcutaneous Q24H  . furosemide  40 mg Intravenous Q12H  . gentamicin irrigation  80 mg Irrigation On Call  . hydrALAZINE  25 mg Oral Q8H  . lisinopril  40 mg Oral Daily  . methimazole  5 mg Oral Daily  . pyridOXINE   100 mg Oral Daily  . sertraline  50 mg Oral Daily  . simvastatin  20 mg Oral QPM  . DISCONTD:  ceFAZolin (ANCEF) IV  1 g Intravenous Q6H  . DISCONTD: chlorhexidine  15 mL Mouth Rinse BID  . DISCONTD: ciprofloxacin  400 mg Intravenous Q24H  . DISCONTD: enoxaparin  30 mg Subcutaneous Q24H  . DISCONTD: multivitamin  1 tablet Oral Daily  . DISCONTD: omega-3 acid ethyl esters  1 g Oral BID   Continuous Infusions:    . DISCONTD: sodium chloride    . DISCONTD: sodium chloride 75 mL/hr at 09/23/11 2137  . DISCONTD: sodium chloride 10 mL/hr at 09/24/11 0035  . DISCONTD: sodium chloride 50 mL/hr at 09/24/11 1315  . DISCONTD: nitroGLYCERIN Stopped (09/24/11 1800)  PRN Meds:.acetaminophen, acetaminophen, acetaminophen, hydrALAZINE, HYDROcodone-acetaminophen, menthol-cetylpyridinium, metoCLOPramide (REGLAN) injection, metoCLOPramide, morphine, ondansetron (ZOFRAN) IV, ondansetron (ZOFRAN) IV, ondansetron, phenol, DISCONTD: 0.9 % irrigation (POUR BTL), DISCONTD: droperidol, DISCONTD: HYDROcodone-acetaminophen, DISCONTD: HYDROmorphone  ECHO: Study Conclusions  - Left ventricle: The cavity size was normal. Wall thickness was normal. Systolic function was normal. The estimated ejection fraction was in the range of 55% to 60%. - Mitral valve: Calcified annulus. Mild to moderate regurgitation. - Left atrium: The atrium was moderately dilated. - Right atrium: The atrium was moderately dilated. - Atrial septum: No defect or patent foramen ovale was identified. - Tricuspid valve: Moderate-severe regurgitation. - Pulmonic valve: Moderate regurgitation. - Pulmonary arteries: PA peak pressure: 67mm Hg (S).   Assessment/Plan: Principal Problem:  *Heart block- complete/ History of cardiac arrest S/p pacemaker placement.    Hypertension Off Nitro drip now. BP improved but not completely controlled. Resumed home dose of Norvasc today.    Fracture, intertrochanteric, right femur S/p ORIF yesterday.  PT will be started. Partial weight bearing on right.   Pulm HTN with Mod to severe TR and Pulm Regurg - noted on ECHO this admission  Pulmonary edema and hypoxia today (see CXR) Started Lasix today.  F/u on pulse ox tomorrow.   UTI- e. Coli Cont Cipro- started on 3/27 but d/c'd yesterday during procedures. Will resume now.    Hyperlipidemia   Tobacco use Tobacco cessation counseling done.    Hyperthyroidism Tapazol  Disposition: Transfer out of SDU today   LOS: 2 days   [facility] [phone] 09/25/2011, 1:58 PM

## 2011-09-25 NOTE — Progress Notes (Signed)
Pt. Temperature 100.7, pt refusing tylenol if temp gets greater than 101, explained to patient the benefit of taking tylenol and still refused. Pt. Also refusing to be turned throughout the night. Will continue to monitor.

## 2011-09-26 LAB — CBC
HCT: 24.2 % — ABNORMAL LOW (ref 36.0–46.0)
Hemoglobin: 8.4 g/dL — ABNORMAL LOW (ref 12.0–15.0)
MCHC: 34.7 g/dL (ref 30.0–36.0)
MCV: 89 fL (ref 78.0–100.0)
RDW: 16 % — ABNORMAL HIGH (ref 11.5–15.5)

## 2011-09-26 MED ORDER — POTASSIUM CHLORIDE CRYS ER 20 MEQ PO TBCR
40.0000 meq | EXTENDED_RELEASE_TABLET | Freq: Once | ORAL | Status: AC
  Administered 2011-09-26: 40 meq via ORAL
  Filled 2011-09-26: qty 2

## 2011-09-26 MED ORDER — AMLODIPINE BESYLATE 5 MG PO TABS
5.0000 mg | ORAL_TABLET | Freq: Every day | ORAL | Status: DC
  Administered 2011-09-27 – 2011-09-28 (×2): 5 mg via ORAL
  Filled 2011-09-26 (×2): qty 1

## 2011-09-26 MED ORDER — CIPROFLOXACIN HCL 500 MG PO TABS
500.0000 mg | ORAL_TABLET | Freq: Two times a day (BID) | ORAL | Status: DC
  Administered 2011-09-26 – 2011-09-28 (×4): 500 mg via ORAL
  Filled 2011-09-26 (×7): qty 1

## 2011-09-26 MED ORDER — AMLODIPINE BESYLATE 2.5 MG PO TABS
2.5000 mg | ORAL_TABLET | Freq: Every day | ORAL | Status: DC
  Administered 2011-09-26: 2.5 mg via ORAL
  Filled 2011-09-26: qty 1

## 2011-09-26 NOTE — Progress Notes (Signed)
PCP: Sissy Hoff, MD, MD  Brief HPI:  78 y/o female admitted after a fall and fracture of right hip and found to have 3rd degree heart block and uncontrolled BP. Bp was initially controlled with Nitro drip, PO and PRN IV Hydralazine. She underwent Pacer placement and ORIF on 3/28.   Past medical history:  Past Medical History   Diagnosis  Date   .  Hypertension    .  High cholesterol    .  Hyperthyroidism    .  Arthritis    .  Depression    .  Broken hip  09/22/11     right; S/P fall   .  Dysrhythmia      bradycardia     Consultants: [facility] Cards, Ortho  Procedures: Pacemaker placement, Right Hip ORIF  Subjective: Patient denies any complaints. No pain. No dizziness.  Objective: Vital signs in last 24 hours: Temp:  [99 F (37.2 C)-100.7 F (38.2 C)] 99 F (37.2 C) (03/30 0429) Pulse Rate:  [64-87] 64  (03/30 0840) Resp:  [17-65] 17  (03/30 0429) BP: (145-191)/(53-73) 160/62 mmHg (03/30 0745) SpO2:  [93 %-99 %] 94 % (03/30 0840) Weight change:  Last BM Date: 09/23/11  Intake/Output from previous day: 03/29 0701 - 03/30 0700 In: 1200 [P.O.:1200] Out: 2270 [Urine:2270] Intake/Output this shift:    General appearance: alert, cooperative, appears stated age and no distress Head: Normocephalic, without obvious abnormality, atraumatic Neck: no adenopathy, no carotid bruit, no JVD, supple, symmetrical, trachea midline and thyroid not enlarged, symmetric, no tenderness/mass/nodules Resp: clear to auscultation bilaterally Cardio: regular rate and rhythm, S1, S2 normal, no murmur, click, rub or gallop GI: soft, non-tender; bowel sounds normal; no masses,  no organomegaly Extremities: extremities normal, atraumatic, no cyanosis or edema Pulses: 2+ and symmetric Skin: Skin color, texture, turgor normal. No rashes or lesions Neurologic: Grossly normal  Lab Results:  Basename 09/26/11 0600 09/25/11 0535  WBC 8.7 10.7*  HGB 8.4* 8.6*  HCT 24.2* 25.2*  PLT 122* 142*    BMET  Basename 09/24/11 0525  NA 131*  K 3.7  CL 98  CO2 25  GLUCOSE 139*  BUN 18  CREATININE 0.60  CALCIUM 9.0  ALT --    Studies/Results: Dg Hip Operative Right  09/24/2011  *RADIOLOGY REPORT*  Clinical Data: Fracture fixation.  OPERATIVE RIGHT HIP  Comparison: Plain films right hip 09/23/2011.  Findings: We are provided with four fluoroscopic spot views of the right hip.  Images demonstrate a dynamic hip screw and short IM nail with a single distal interlocking screw for fixation of an intertrochanteric fracture.  Position and alignment appear near anatomic.  No new abnormality.  IMPRESSION: ORIF right intertrochanteric fracture.  Original Report Authenticated By: Bernadene Bell. D'ALESSIO, M.D.   Dg Chest Portable 1 View  09/25/2011  *RADIOLOGY REPORT*  Clinical Data: Postop pacemaker placement.  PORTABLE CHEST - 1 VIEW  Comparison: 09/23/2011  Findings: Patient is rotated towards the left side.  There is now a dual lead left cardiac pacemaker.  The ventricular lead is not completely imaged.  There are new basilar densities which may represent dependent edema and/or effusions.  No evidence for a large pneumothorax.  The heart size appears to be grossly stable.  IMPRESSION: Placement of a left cardiac pacemaker as described.  No evidence for a pneumothorax  Increased basilar densities may represent dependent edema and small effusions.  Original Report Authenticated By: Richarda Overlie, M.D.    Medications:  Scheduled:   .  amLODipine  2.5 mg Oral Daily  . antiseptic oral rinse  15 mL Mouth Rinse q12n4p  . aspirin EC  325 mg Oral Daily  . calcium-vitamin D  1 tablet Oral Daily  . ciprofloxacin  400 mg Intravenous Q24H  . enoxaparin  30 mg Subcutaneous Q24H  . furosemide  40 mg Intravenous Q12H  . hydrALAZINE  25 mg Oral Q8H  . lisinopril  40 mg Oral Daily  . methimazole  5 mg Oral Daily  . pyridOXINE  100 mg Oral Daily  . sertraline  50 mg Oral Daily  . simvastatin  20 mg Oral QPM     Assessment/Plan:  Principal Problem:  *Heart block Active Problems:  Hypertension  Fracture, intertrochanteric, right femur  Hyperlipidemia  Tobacco use  Hyperthyroidism  History of cardiac arrest    Heart block- complete/ History of cardiac arrest  S/p pacemaker placement. Stable. Cards following. Ok for rehab.  Hypertension  BP improved but not completely controlled. Increase dose of Norvasc.  Fracture, intertrochanteric, right femur  Stable S/p ORIF. PT/OT will be started. Mng per Ortho  Pulm HTN with Mod to severe TR and Pulm Regurg - noted on ECHO this admission  Stable  Pulmonary edema and hypoxia (see CXR)  On IV lasix as of yesterday. Negative balance. Check CXR in AM and change to oral lasix from 3/31 if better. Lungs dont reveal any crackles currently. Check lytes.  UTI- e. Coli  Change to PO cipro.  Anemia Drop in H/H noted. Continue to monitor. No overt bleeding noted. Consider transfusion if drops further.  Hyperlipidemia  Stable  Tobacco use  Tobacco cessation counseling done.   Hyperthyroidism  Tapazol   Disposition: Patient is stable. Possible discharge to SNF on 4/1.    LOS: 3 days   [facility] Pager [phone] 09/26/2011, 1:22 PM

## 2011-09-26 NOTE — Progress Notes (Signed)
Bed offers given to grandaughter.  Support and education provided. Grandaughter encouraged to f/u with CSW with questions and to visit facilities.   Milus Banister MSW,LCSW w/e Coverage [phone]

## 2011-09-26 NOTE — Progress Notes (Signed)
Pt. Angry when woken up, not cooperating to do Steward sedation scale. Pt. Stated they do not want to be woken up anymore tonight. Will continue to monitor patient.

## 2011-09-26 NOTE — Progress Notes (Signed)
Physical Therapy Treatment Patient Details Name: Kathryn [name] MRN: [id number] DOB: 08-06-1922 Today's Date: 09/26/2011  PT Assessment/Plan  PT - Assessment/Plan Comments on Treatment Session: Pt progressing s/p pacemaker and Rfemur ORIF. Pt limited by cognition and benefits from max encouragement and cueing. Will continue to follow to maximize function. PT Plan: Discharge plan remains appropriate Follow Up Recommendations: Skilled nursing facility PT Goals  Acute Rehab PT Goals PT Goal: Supine/Side to Sit - Progress: Progressing toward goal PT Goal: Sit to Stand - Progress: Progressing toward goal PT Transfer Goal: Bed to Chair/Chair to Bed - Progress: Progressing toward goal PT Goal: Ambulate - Progress: Progressing toward goal  PT Treatment Precautions/Restrictions  Precautions Precautions: Fall;ICD/Pacemaker Required Braces or Orthoses: No Restrictions Weight Bearing Restrictions: Yes RLE Weight Bearing: Weight bearing as tolerated Other Position/Activity Restrictions: WBAT Mobility (including Balance) Bed Mobility Supine to Sit: 3: Mod assist;HOB flat Supine to Sit Details (indicate cue type and reason): cueing and facilitation to complete transfer Sitting - Scoot to Edge of Bed: 3: Mod assist Sitting - Scoot to Edge of Bed Details (indicate cue type and reason): pad and assist for reciprocal scooting with cues not to pull with LUE Transfers Sit to Stand: 4: Min assist;From bed Sit to Stand Details (indicate cue type and reason): pt holding onto RW despite cueing Stand to Sit: 4: Min assist;To chair/3-in-1 Stand to Sit Details: cueing for sequence and safety Ambulation/Gait Ambulation/Gait: Yes Ambulation/Gait Assistance Details (indicate cue type and reason): constant cueing to step into RW and directional cues with assist to direct RW, +1 to follow with chair for safety Ambulation Distance (Feet): 60 Feet Assistive device: Rolling walker Gait Pattern:  Step-through pattern;Trunk flexed;Shuffle Stairs: No  Posture/Postural Control Posture/Postural Control: Postural limitations Postural Limitations: kyphotic posture in standing Exercise    End of Session PT - End of Session Equipment Utilized During Treatment: Gait belt Activity Tolerance: Patient tolerated treatment well Patient left: in chair Nurse Communication: Mobility status for transfers;Mobility status for ambulation General Behavior During Session: [name] for tasks performed Cognition: Impaired Cognitive Impairment: decreased safety awareness and STM  Toney Sang [facility] 09/26/2011, 8:49 AM Delaney Meigs, PT [phone]

## 2011-09-26 NOTE — Progress Notes (Signed)
Patient ID: Kathryn [name], female   DOB: 1923-01-24, 78 y.o.   MRN: [id number]   SUBJECTIVE: Patient is resting comfortably.   Filed Vitals:   09/26/11 0433 09/26/11 0440 09/26/11 0711 09/26/11 0712  BP: 182/72 162/60 173/70 169/73  Pulse:      Temp:      TempSrc:      Resp:      Height:      Weight:      SpO2:        Intake/Output Summary (Last 24 hours) at 09/26/11 0742 Last data filed at 09/26/11 0500  Gross per 24 hour  Intake   1200 ml  Output   2270 ml  Net  -1070 ml    LABS: Basic Metabolic Panel:  Basename 09/24/11 0525 09/23/11 1907 09/23/11 1310  NA 131* -- 130*  K 3.7 -- 4.0  CL 98 -- 95*  CO2 25 -- 24  GLUCOSE 139* -- 188*  BUN 18 -- 16  CREATININE 0.60 -- 0.50  CALCIUM 9.0 -- 8.8  MG 2.3 1.6 --  PHOS -- -- --   Liver Function Tests: No results found for this basename: AST:2,ALT:2,ALKPHOS:2,BILITOT:2,PROT:2,ALBUMIN:2 in the last 72 hours No results found for this basename: LIPASE:2,AMYLASE:2 in the last 72 hours CBC:  Basename 09/26/11 0600 09/25/11 0535 09/23/11 1310  WBC 8.7 10.7* --  NEUTROABS -- -- 14.0*  HGB 8.4* 8.6* --  HCT 24.2* 25.2* --  MCV 89.0 88.7 --  PLT PENDING 142* --   Cardiac Enzymes:  Basename 09/24/11 0525 09/23/11 1907 09/23/11 1300  CKTOTAL 95 -- 64  CKMB 3.9 -- 3.7  CKMBINDEX -- -- --  TROPONINI <0.30 <0.30 <0.30   BNP: No components found with this basename: POCBNP:3 D-Dimer: No results found for this basename: DDIMER:2 in the last 72 hours Hemoglobin A1C: No results found for this basename: HGBA1C in the last 72 hours Fasting Lipid Panel: No results found for this basename: CHOL,HDL,LDLCALC,TRIG,CHOLHDL,LDLDIRECT in the last 72 hours Thyroid Function Tests:  [facility] 09/23/11 1907  TSH 1.732  T4TOTAL --  T3FREE --  THYROIDAB --    RADIOLOGY: Dg Chest 1 View  09/23/2011  *RADIOLOGY REPORT*  Clinical Data: Right femoral neck fracture.  Preoperative respiratory evaluation.  CHEST - 1 VIEW  09/23/2011:  Comparison: None.  Findings: Cardiac silhouette moderately enlarged.  Prominent paracardiac fat pad on the left.  Thoracic aorta atherosclerotic. Hilar and mediastinal contours otherwise unremarkable.  Prominent bronchovascular markings diffusely and moderate central peribronchial thickening.  No localized airspace consolidation.  No evidence of interstitial pulmonary edema.  No visible pleural effusions.  Multiple left rib fractures, generalized osteopenia, and degenerative changes in the shoulder joints.  IMPRESSION: Moderate cardiomegaly without pulmonary edema.  Moderate changes of bronchitis and/or asthma, likely chronic.  No convincing evidence of acute cardiopulmonary disease.  Original Report Authenticated By: Arnell Sieving, M.D.   Dg Hip Complete Right  09/23/2011  *RADIOLOGY REPORT*  Clinical Data: Fall with right hip pain.  RIGHT HIP - COMPLETE 2+ VIEW  Comparison: None.  Findings: There is an intertrochanteric fracture of the right femur with varus angulation of the fracture fragments.  Mild degenerative changes in both hips.  No dislocation.  Osteopenia.  Degenerative changes in the visualized portion of the spine.  Vascular calcifications.  IMPRESSION:  1.  Intertrochanteric right femur fracture. Per CMS PQRS reporting requirements (PQRS Measure 24): Given the patient's age of greater than 50 and the fracture site (hip, distal radius, or spine), the patient  should be tested for osteoporosis using DXA, and the appropriate treatment considered based on the DXA results. 2.  Osteopenia. 3.  Spondylosis.  Original Report Authenticated By: Reyes Ivan, M.D.   Dg Hip Operative Right  09/24/2011  *RADIOLOGY REPORT*  Clinical Data: Fracture fixation.  OPERATIVE RIGHT HIP  Comparison: Plain films right hip 09/23/2011.  Findings: We are provided with four fluoroscopic spot views of the right hip.  Images demonstrate a dynamic hip screw and short IM nail with a single distal  interlocking screw for fixation of an intertrochanteric fracture.  Position and alignment appear near anatomic.  No new abnormality.  IMPRESSION: ORIF right intertrochanteric fracture.  Original Report Authenticated By: Bernadene Bell. Maricela Curet, M.D.   Ct Head Wo Contrast  09/23/2011  *RADIOLOGY REPORT*  Clinical Data:  Larey Seat.  Found down.  CT HEAD WITHOUT CONTRAST CT CERVICAL SPINE WITHOUT CONTRAST  Technique:  Multidetector CT imaging of the head and cervical spine was performed following the standard protocol without intravenous contrast.  Multiplanar CT image reconstructions of the cervical spine were also generated.  Comparison:  None  CT HEAD  Findings: Age related cerebral atrophy, ventriculomegaly and periventricular white matter disease.  Remote appearing lacunar type infarct in the right basal ganglia region and mild ex vacuo dilatation of the frontal horn the right lateral ventricle.  No CT findings for acute hemispheric infarction and/or intracranial hemorrhage.  No extra-axial fluid collections are identified.  The brainstem and cerebellum grossly normal.  The bony structures are intact.  No skull fracture.  The paranasal sinuses and mastoid air cells are clear except for minimal debris in the right frontal sinus.  Osteoporotic changes involving the skull.  IMPRESSION:  1.  Age related cerebral atrophy, ventriculomegaly and periventricular white matter disease. 2.  Remote lacunar type basal ganglia infarcts. 3.  No skull fracture or subdural hematoma.  CT CERVICAL SPINE  Findings: Advanced degenerative cervical spondylosis with disc disease and facet disease.  The overall alignment is maintained. Mild multilevel degenerative subluxations.  The facets are normally aligned.  Advanced facet degenerative changes.  The skull base C1 and C1-2 articulations are maintained.  The dens is intact.  The lung apices are clear.  IMPRESSION:  1.  Advanced degenerative cervical spondylosis with disc disease and facet  disease. 2.  No acute cervical spine fracture.  Original Report Authenticated By: P. Loralie Champagne, M.D.   Ct Cervical Spine Wo Contrast  09/23/2011  *RADIOLOGY REPORT*  Clinical Data:  Larey Seat.  Found down.  CT HEAD WITHOUT CONTRAST CT CERVICAL SPINE WITHOUT CONTRAST  Technique:  Multidetector CT imaging of the head and cervical spine was performed following the standard protocol without intravenous contrast.  Multiplanar CT image reconstructions of the cervical spine were also generated.  Comparison:  None  CT HEAD  Findings: Age related cerebral atrophy, ventriculomegaly and periventricular white matter disease.  Remote appearing lacunar type infarct in the right basal ganglia region and mild ex vacuo dilatation of the frontal horn the right lateral ventricle.  No CT findings for acute hemispheric infarction and/or intracranial hemorrhage.  No extra-axial fluid collections are identified.  The brainstem and cerebellum grossly normal.  The bony structures are intact.  No skull fracture.  The paranasal sinuses and mastoid air cells are clear except for minimal debris in the right frontal sinus.  Osteoporotic changes involving the skull.  IMPRESSION:  1.  Age related cerebral atrophy, ventriculomegaly and periventricular white matter disease. 2.  Remote lacunar type basal ganglia infarcts.  3.  No skull fracture or subdural hematoma.  CT CERVICAL SPINE  Findings: Advanced degenerative cervical spondylosis with disc disease and facet disease.  The overall alignment is maintained. Mild multilevel degenerative subluxations.  The facets are normally aligned.  Advanced facet degenerative changes.  The skull base C1 and C1-2 articulations are maintained.  The dens is intact.  The lung apices are clear.  IMPRESSION:  1.  Advanced degenerative cervical spondylosis with disc disease and facet disease. 2.  No acute cervical spine fracture.  Original Report Authenticated By: P. Loralie Champagne, M.D.   Dg Chest Portable 1  View  09/25/2011  *RADIOLOGY REPORT*  Clinical Data: Postop pacemaker placement.  PORTABLE CHEST - 1 VIEW  Comparison: 09/23/2011  Findings: Patient is rotated towards the left side.  There is now a dual lead left cardiac pacemaker.  The ventricular lead is not completely imaged.  There are new basilar densities which may represent dependent edema and/or effusions.  No evidence for a large pneumothorax.  The heart size appears to be grossly stable.  IMPRESSION: Placement of a left cardiac pacemaker as described.  No evidence for a pneumothorax  Increased basilar densities may represent dependent edema and small effusions.  Original Report Authenticated By: Richarda Overlie, M.D.    PHYSICAL EXAM  Pacemaker site is bandaged. There is no swelling.   TELEMETRY: Telemetry is reviewed by me. There is sinus rhythm.   ASSESSMENT AND PLAN:  Principal Problem:  *Heart block    Patient's pacemaker is in place and stable. Recommendations have been made in prior notes. Rehabilitation can be undertaken. Careful attention will have to be given to not over extending the position of her arm. Followup will need to be arranged post hospitalization.   Willa Rough 09/26/2011 7:42 AM

## 2011-09-26 NOTE — Progress Notes (Signed)
Subject Awake and alert states hip feel stiff Objective: Vital signs in last 24 hours: Temp:  [99 F (37.2 C)-100.7 F (38.2 C)] 99 F (37.2 C) (03/30 0429) Pulse Rate:  [64-91] 64  (03/30 0840) Resp:  [17-65] 17  (03/30 0429) BP: (145-191)/(49-73) 160/62 mmHg (03/30 0745) SpO2:  [93 %-99 %] 94 % (03/30 0840)  Intake/Output from previous day: 03/29 0701 - 03/30 0700 In: 1200 [P.O.:1200] Out: 2270 [Urine:2270] Intake/Output this shift:     Basename 09/26/11 0600 09/25/11 0535 09/24/11 0525 09/23/11 1310  HGB 8.4* 8.6* 10.3* 11.8*    Basename 09/26/11 0600 09/25/11 0535  WBC 8.7 10.7*  RBC 2.72* 2.84*  HCT 24.2* 25.2*  PLT 122* 142*    Basename 09/24/11 0525 09/23/11 1310  NA 131* 130*  K 3.7 4.0  CL 98 95*  CO2 25 24  BUN 18 16  CREATININE 0.60 0.50  GLUCOSE 139* 188*  CALCIUM 9.0 8.8    Basename 09/24/11 0525  LABPT --  INR 1.26   Thigh OK calf soft moves foot and ankle well good cap refill Incision: dressing C/D/I  Assessment/Plan: Ortho stable   No change in plan continue therapies   Anylah Scheib ANDREW 09/26/2011, 11:46 AM

## 2011-09-26 NOTE — Progress Notes (Signed)
Pt. B/P 162/60 at 0430, am dose of hydralazine given. B/P assessed again at 0740, B/P 160/60, day shift RN notified. Will reassess patient and address B/P.

## 2011-09-26 NOTE — Op Note (Signed)
NAME:  Kathryn [name], Kathryn [name]                ACCOUNT NO.:  MEDICAL RECORD NO.:  [id number]  LOCATION:                                 FACILITY:  PHYSICIAN:  Doylene Canning. Ladona Ridgel, MD    DATE OF BIRTH:  February 22, 1923  DATE OF PROCEDURE:  09/24/2011 DATE OF DISCHARGE:                              OPERATIVE REPORT   ELECTROPHYSIOLOGIC PROCEDURE NOTE  PROCEDURE PERFORMED:  Insertion of a dual-chamber pacemaker.  INDICATION:  Symptomatic complete heart block.  INTRODUCTION:  The patient is a pleasant, 78 year old woman with a syncopal episode resulting in a fractured hip.  She was subsequently found in the emergency room to have complete heart block and actually had a polymorphic ventricular tachycardia after a PVC occurred during the her complete heart block.  She is now referred for pacemaker insertion.  PROCEDURE:  After informed consent was obtained, the patient was taken to the diagnostic EP lab in a fasting state.  After usual preparation and draping, intravenous fentanyl and midazolam were given for sedation. A 30 mL of lidocaine was infiltrated over the left infraclavicular region.  A 5-cm incision was carried out over this region. Electrocautery was utilized to dissect down to the fascia plane.  The left subclavian vein was then punctured x2 after 10 mL of contrast, demonstrated that the vein was displaced somewhat caudally.  The [employer], model [phone], active fixation pacing leads, serial number [id number], was advanced into the right ventricle, and the [employer], model 4135, active fixation pacing leads, serial number [id number], was advanced into the right atrium.  Mapping was initially carried out in the right ventricle at the final site.  The R- waves were 12 mV, the pacing impedance was 700 ohms, and the threshold was 0.5 V at 0.4 msec.  There was a prominent injury current with active fixation of the lead.  Attention was then turned to placement of  the atrial lead, was placed in the anterolateral portion of the right atrium where P-waves measured 1.5 mV.  The pacing impedance was 450 ohms and the threshold was 0.7 V at 0.4 msec.  Again, 10 V pacing did not stimulate the diaphragm.  There was a large injury current with active fixation of the lead.  With these leads in satisfactory position, they were secured to the subpectoral fascia with a figure-of-eight silk suture.  The sewing sleeve was secured with silk suture.  Electrocautery was utilized to make subcutaneous pocket.  Antibiotic irrigation was utilized to irrigate the pocket and electrocautery was utilized to assure hemostasis.  The [employer]vantio dual-chamber pacemaker, serial number [id number], was connected to the atrial and ventricular leads and placed back into the subcutaneous pocket.  The generator was secured with silk suture.  The incision was closed with 2- 0 and 3-0 Vicryl.  Benzoin and Steri-Strips were painted on the skin.  A pressure dressing was applied.  The patient was returned to her room in satisfactory condition.  COMPLICATIONS:  There were no immediate procedure complications.  RESULTS:  Demonstrated successful implantation of a [employer] dual-chamber pacemaker in a patient with complete heart block.     Doylene Canning. Ladona Ridgel, MD  GWT/MEDQ  D:  09/24/2011  T:  09/25/2011  Job:  [id number]

## 2011-09-27 ENCOUNTER — Inpatient Hospital Stay (HOSPITAL_COMMUNITY): Payer: Medicare Other

## 2011-09-27 LAB — BASIC METABOLIC PANEL
CO2: 28 mEq/L (ref 19–32)
Calcium: 8.5 mg/dL (ref 8.4–10.5)
Creatinine, Ser: 0.66 mg/dL (ref 0.50–1.10)
GFR calc non Af Amer: 78 mL/min — ABNORMAL LOW (ref >90)
Glucose, Bld: 113 mg/dL — ABNORMAL HIGH (ref 70–99)
Sodium: 131 mEq/L — ABNORMAL LOW (ref 135–145)

## 2011-09-27 LAB — CBC
MCH: 30.8 pg (ref 26.0–34.0)
MCHC: 34.7 g/dL (ref 30.0–36.0)
MCV: 88.6 fL (ref 78.0–100.0)
Platelets: 155 10*3/uL (ref 150–400)

## 2011-09-27 MED ORDER — POTASSIUM CHLORIDE CRYS ER 20 MEQ PO TBCR
20.0000 meq | EXTENDED_RELEASE_TABLET | Freq: Every day | ORAL | Status: DC
  Administered 2011-09-28: 20 meq via ORAL
  Filled 2011-09-27: qty 1

## 2011-09-27 MED ORDER — POTASSIUM CHLORIDE CRYS ER 20 MEQ PO TBCR
40.0000 meq | EXTENDED_RELEASE_TABLET | Freq: Once | ORAL | Status: AC
  Administered 2011-09-27: 40 meq via ORAL
  Filled 2011-09-27: qty 2

## 2011-09-27 MED ORDER — FUROSEMIDE 20 MG PO TABS
20.0000 mg | ORAL_TABLET | Freq: Every day | ORAL | Status: DC
  Administered 2011-09-28: 20 mg via ORAL
  Filled 2011-09-27: qty 1

## 2011-09-27 NOTE — Progress Notes (Signed)
Patient ID: Kathryn [name], female   DOB: 1922/10/29, 78 y.o.   MRN: [id number]   SUBJECTIVE: The patient has no chest pain or shortness of breath today.   Filed Vitals:   09/26/11 2000 09/26/11 2023 09/27/11 0000 09/27/11 0500  BP:  145/54  154/62  Pulse:  93  64  Temp:  99.7 F (37.6 C)  98.6 F (37 C)  TempSrc:  Oral  Oral  Resp: 16 17 18 18   Height:      Weight:      SpO2: 96% 94% 98% 96%    Intake/Output Summary (Last 24 hours) at 09/27/11 0802 Last data filed at 09/27/11 0700  Gross per 24 hour  Intake    480 ml  Output    850 ml  Net   -370 ml    LABS: Basic Metabolic Panel:  Basename 09/27/11 0505  NA 131*  K 3.3*  CL 95*  CO2 28  GLUCOSE 113*  BUN 21  CREATININE 0.66  CALCIUM 8.5  MG --  PHOS --   Liver Function Tests: No results found for this basename: AST:2,ALT:2,ALKPHOS:2,BILITOT:2,PROT:2,ALBUMIN:2 in the last 72 hours No results found for this basename: LIPASE:2,AMYLASE:2 in the last 72 hours CBC:  Basename 09/27/11 0505 09/26/11 0600  WBC 8.4 8.7  NEUTROABS -- --  HGB 8.4* 8.4*  HCT 24.2* 24.2*  MCV 88.6 89.0  PLT 155 122*   Cardiac Enzymes: No results found for this basename: CKTOTAL:3,CKMB:3,CKMBINDEX:3,TROPONINI:3 in the last 72 hours BNP: No components found with this basename: POCBNP:3 D-Dimer: No results found for this basename: DDIMER:2 in the last 72 hours Hemoglobin A1C: No results found for this basename: HGBA1C in the last 72 hours Fasting Lipid Panel: No results found for this basename: CHOL,HDL,LDLCALC,TRIG,CHOLHDL,LDLDIRECT in the last 72 hours Thyroid Function Tests: No results found for this basename: TSH,T4TOTAL,FREET3,T3FREE,THYROIDAB in the last 72 hours  RADIOLOGY: Dg Chest 1 View  09/23/2011  *RADIOLOGY REPORT*  Clinical Data: Right femoral neck fracture.  Preoperative respiratory evaluation.  CHEST - 1 VIEW 09/23/2011:  Comparison: None.  Findings: Cardiac silhouette moderately enlarged.  Prominent  paracardiac fat pad on the left.  Thoracic aorta atherosclerotic. Hilar and mediastinal contours otherwise unremarkable.  Prominent bronchovascular markings diffusely and moderate central peribronchial thickening.  No localized airspace consolidation.  No evidence of interstitial pulmonary edema.  No visible pleural effusions.  Multiple left rib fractures, generalized osteopenia, and degenerative changes in the shoulder joints.  IMPRESSION: Moderate cardiomegaly without pulmonary edema.  Moderate changes of bronchitis and/or asthma, likely chronic.  No convincing evidence of acute cardiopulmonary disease.  Original Report Authenticated By: Arnell Sieving, M.D.   Dg Hip Complete Right  09/23/2011  *RADIOLOGY REPORT*  Clinical Data: Fall with right hip pain.  RIGHT HIP - COMPLETE 2+ VIEW  Comparison: None.  Findings: There is an intertrochanteric fracture of the right femur with varus angulation of the fracture fragments.  Mild degenerative changes in both hips.  No dislocation.  Osteopenia.  Degenerative changes in the visualized portion of the spine.  Vascular calcifications.  IMPRESSION:  1.  Intertrochanteric right femur fracture. Per CMS PQRS reporting requirements (PQRS Measure 24): Given the patient's age of greater than 50 and the fracture site (hip, distal radius, or spine), the patient should be tested for osteoporosis using DXA, and the appropriate treatment considered based on the DXA results. 2.  Osteopenia. 3.  Spondylosis.  Original Report Authenticated By: Reyes Ivan, M.D.   Dg Hip Operative Right  09/24/2011  *  RADIOLOGY REPORT*  Clinical Data: Fracture fixation.  OPERATIVE RIGHT HIP  Comparison: Plain films right hip 09/23/2011.  Findings: We are provided with four fluoroscopic spot views of the right hip.  Images demonstrate a dynamic hip screw and short IM nail with a single distal interlocking screw for fixation of an intertrochanteric fracture.  Position and alignment appear near  anatomic.  No new abnormality.  IMPRESSION: ORIF right intertrochanteric fracture.  Original Report Authenticated By: Bernadene Bell. Maricela Curet, M.D.   Ct Head Wo Contrast  09/23/2011  *RADIOLOGY REPORT*  Clinical Data:  Larey Seat.  Found down.  CT HEAD WITHOUT CONTRAST CT CERVICAL SPINE WITHOUT CONTRAST  Technique:  Multidetector CT imaging of the head and cervical spine was performed following the standard protocol without intravenous contrast.  Multiplanar CT image reconstructions of the cervical spine were also generated.  Comparison:  None  CT HEAD  Findings: Age related cerebral atrophy, ventriculomegaly and periventricular white matter disease.  Remote appearing lacunar type infarct in the right basal ganglia region and mild ex vacuo dilatation of the frontal horn the right lateral ventricle.  No CT findings for acute hemispheric infarction and/or intracranial hemorrhage.  No extra-axial fluid collections are identified.  The brainstem and cerebellum grossly normal.  The bony structures are intact.  No skull fracture.  The paranasal sinuses and mastoid air cells are clear except for minimal debris in the right frontal sinus.  Osteoporotic changes involving the skull.  IMPRESSION:  1.  Age related cerebral atrophy, ventriculomegaly and periventricular white matter disease. 2.  Remote lacunar type basal ganglia infarcts. 3.  No skull fracture or subdural hematoma.  CT CERVICAL SPINE  Findings: Advanced degenerative cervical spondylosis with disc disease and facet disease.  The overall alignment is maintained. Mild multilevel degenerative subluxations.  The facets are normally aligned.  Advanced facet degenerative changes.  The skull base C1 and C1-2 articulations are maintained.  The dens is intact.  The lung apices are clear.  IMPRESSION:  1.  Advanced degenerative cervical spondylosis with disc disease and facet disease. 2.  No acute cervical spine fracture.  Original Report Authenticated By: P. Loralie Champagne, M.D.     Ct Cervical Spine Wo Contrast  09/23/2011  *RADIOLOGY REPORT*  Clinical Data:  Larey Seat.  Found down.  CT HEAD WITHOUT CONTRAST CT CERVICAL SPINE WITHOUT CONTRAST  Technique:  Multidetector CT imaging of the head and cervical spine was performed following the standard protocol without intravenous contrast.  Multiplanar CT image reconstructions of the cervical spine were also generated.  Comparison:  None  CT HEAD  Findings: Age related cerebral atrophy, ventriculomegaly and periventricular white matter disease.  Remote appearing lacunar type infarct in the right basal ganglia region and mild ex vacuo dilatation of the frontal horn the right lateral ventricle.  No CT findings for acute hemispheric infarction and/or intracranial hemorrhage.  No extra-axial fluid collections are identified.  The brainstem and cerebellum grossly normal.  The bony structures are intact.  No skull fracture.  The paranasal sinuses and mastoid air cells are clear except for minimal debris in the right frontal sinus.  Osteoporotic changes involving the skull.  IMPRESSION:  1.  Age related cerebral atrophy, ventriculomegaly and periventricular white matter disease. 2.  Remote lacunar type basal ganglia infarcts. 3.  No skull fracture or subdural hematoma.  CT CERVICAL SPINE  Findings: Advanced degenerative cervical spondylosis with disc disease and facet disease.  The overall alignment is maintained. Mild multilevel degenerative subluxations.  The facets are normally aligned.  Advanced facet degenerative changes.  The skull base C1 and C1-2 articulations are maintained.  The dens is intact.  The lung apices are clear.  IMPRESSION:  1.  Advanced degenerative cervical spondylosis with disc disease and facet disease. 2.  No acute cervical spine fracture.  Original Report Authenticated By: P. Loralie Champagne, M.D.   Dg Chest Portable 1 View  09/25/2011  *RADIOLOGY REPORT*  Clinical Data: Postop pacemaker placement.  PORTABLE CHEST - 1 VIEW   Comparison: 09/23/2011  Findings: Patient is rotated towards the left side.  There is now a dual lead left cardiac pacemaker.  The ventricular lead is not completely imaged.  There are new basilar densities which may represent dependent edema and/or effusions.  No evidence for a large pneumothorax.  The heart size appears to be grossly stable.  IMPRESSION: Placement of a left cardiac pacemaker as described.  No evidence for a pneumothorax  Increased basilar densities may represent dependent edema and small effusions.  Original Report Authenticated By: Richarda Overlie, M.D.    PHYSICAL EXAM  The bandage remains in place over her pacemaker site. It is stable.   TELEMETRY: I personally reviewed telemetry. There is appropriate pacing.  ASSESSMENT AND PLAN:   *Heart block   Pacemaker is working well. As outlined previously, patient can have full continued rehabilitation as long as very careful attention is paid to the position of her left arm.  Willa Rough 09/27/2011 8:02 AM

## 2011-09-27 NOTE — Progress Notes (Signed)
PCP: Sissy Hoff, MD, MD  Brief HPI:  78 y/o female admitted after a fall and fracture of right hip and found to have 3rd degree heart block and uncontrolled BP. Bp was initially controlled with Nitro drip, PO and PRN IV Hydralazine. She underwent Pacer placement and ORIF on 3/28.   Past medical history:  Past Medical History   Diagnosis  Date   .  Hypertension    .  High cholesterol    .  Hyperthyroidism    .  Arthritis    .  Depression    .  Broken hip  09/22/11     right; S/P fall   .  Dysrhythmia      bradycardia     Consultants: [facility] Cards, Ortho  Procedures: Pacemaker placement, Right Hip ORIF  Subjective: Patient denies any complaints. She is aware she needs to go for rehab.  Objective: Vital signs in last 24 hours: Temp:  [97.6 F (36.4 C)-99.7 F (37.6 C)] 98.6 F (37 C) (03/31 0500) Pulse Rate:  [64-93] 64  (03/31 0500) Resp:  [16-18] 18  (03/31 0500) BP: (145-154)/(54-71) 151/71 mmHg (03/31 1008) SpO2:  [92 %-98 %] 96 % (03/31 0500) Weight change:  Last BM Date: 09/23/11  Intake/Output from previous day: 03/30 0701 - 03/31 0700 In: 720 [P.O.:720] Out: 850 [Urine:850] Intake/Output this shift:    General appearance: alert, cooperative, appears stated age and no distress Head: Normocephalic, without obvious abnormality, atraumatic Resp: clear to auscultation bilaterally Cardio: regular rate and rhythm, S1, S2 normal, no murmur, click, rub or gallop GI: soft, non-tender; bowel sounds normal; no masses,  no organomegaly Extremities: extremities normal, atraumatic, no cyanosis or edema Pulses: 2+ and symmetric Skin: Skin color, texture, turgor normal. No rashes or lesions Neurologic: Grossly normal  Lab Results:  Basename 09/27/11 0505 09/26/11 0600  WBC 8.4 8.7  HGB 8.4* 8.4*  HCT 24.2* 24.2*  PLT 155 122*   BMET  Basename 09/27/11 0505  NA 131*  K 3.3*  CL 95*  CO2 28  GLUCOSE 113*  BUN 21  CREATININE 0.66  CALCIUM 8.5  ALT --     Studies/Results: Dg Chest Port 1 View  09/27/2011  *RADIOLOGY REPORT*  Clinical Data: Pulmonary edema.  PORTABLE CHEST - 1 VIEW  Comparison: 09/25/2011  Findings: Stable left subclavian transvenous pacemaker.  Stable cardiomegaly.  Interval improvement in the pulmonary vascular congestion and mild perihilar/bibasilar interstitial edema or infiltrates.  Cannot exclude small pleural effusions.  Atheromatous aortic arch.  IMPRESSION:  1.  Interval improvement in pulmonary vascular congestion and mild interstitial edema/infiltrates.  Original Report Authenticated By: Osa Craver, M.D.    Medications:  Scheduled:    . amLODipine  5 mg Oral Daily  . antiseptic oral rinse  15 mL Mouth Rinse q12n4p  . aspirin EC  325 mg Oral Daily  . calcium-vitamin D  1 tablet Oral Daily  . ciprofloxacin  500 mg Oral BID  . enoxaparin  30 mg Subcutaneous Q24H  . hydrALAZINE  25 mg Oral Q8H  . lisinopril  40 mg Oral Daily  . methimazole  5 mg Oral Daily  . potassium chloride  40 mEq Oral Once  . potassium chloride  40 mEq Oral Once  . pyridOXINE  100 mg Oral Daily  . sertraline  50 mg Oral Daily  . simvastatin  20 mg Oral QPM  . DISCONTD: amLODipine  2.5 mg Oral Daily  . DISCONTD: ciprofloxacin  400 mg Intravenous Q24H  .  DISCONTD: furosemide  40 mg Intravenous Q12H    Assessment/Plan:  Principal Problem:  *Heart block Active Problems:  Hypertension  Fracture, intertrochanteric, right femur  Hyperlipidemia  Tobacco use  Hyperthyroidism  History of cardiac arrest    Heart block- complete/ History of cardiac arrest  S/p pacemaker placement. Stable. Cards following. Ok for rehab.  Hypertension  BP improved but not completely controlled. Improved with increasing dose of Amlodipine.  Fracture, intertrochanteric, right femur  Stable S/p ORIF. PT/OT will be started. Mng per Ortho. Will need f/u with Dr. Stefano Gaul HTN with Mod to severe TR and Pulm Regurg - noted on ECHO this  admission  Stable  Pulmonary edema and hypoxia (see CXR)  Improved with lasix. CXR shows improvement. Change to oral lasix at low dose. No systolic dysfunction on ECHO. No diatolic dysfunction either.   Hypokalemia Replete  UTI- e. Coli  Change to PO cipro.  Anemia H/H stable. No overt bleeding noted. Consider transfusion if drops further.  Hyperlipidemia  Stable  Tobacco use  Tobacco cessation counseling done.   Hyperthyroidism  Tapazol   Disposition: Patient is stable. Possible discharge to SNF on 4/1.    LOS: 4 days   [facility] Pager [phone] 09/27/2011, 10:19 AM

## 2011-09-27 NOTE — Progress Notes (Signed)
Subjective: Procedure(s) (LRB): INTRAMEDULLARY (IM) NAIL INTERTROCHANTRIC (Right) 3 Days Post-Op  Patient reports pain as mild.  Positive void- patient has foley Negative bowel movement Positive flatus Negative chest pain or shortness of breath  Objective: Vital signs in last 24 hours: Temp:  [97.6 F (36.4 C)-99.7 F (37.6 C)] 98.6 F (37 C) (03/31 0500) Pulse Rate:  [64-93] 64  (03/31 0500) Resp:  [16-18] 18  (03/31 0500) BP: (145-154)/(54-62) 154/62 mmHg (03/31 0500) SpO2:  [92 %-98 %] 96 % (03/31 0500)  Intake/Output from previous day: 03/30 0701 - 03/31 0700 In: 720 [P.O.:720] Out: 850 [Urine:850]   Basename 09/27/11 0505 09/26/11 0600  WBC 8.4 8.7  RBC 2.73* 2.72*  HCT 24.2* 24.2*  PLT 155 122*    Basename 09/27/11 0505  NA 131*  K 3.3*  CL 95*  CO2 28  BUN 21  CREATININE 0.66  GLUCOSE 113*  CALCIUM 8.5   No results found for this basename: LABPT:2,INR:2 in the last 72 hours  ABD soft Sensation intact distally Intact pulses distally Dorsiflexion/Plantar flexion intact Incision: dressing C/D/I Compartment soft  Assessment/Plan: Patient stable  No change to therapies; continue mobilization Pacemaker per [facility] Likely going to SNF at [place]- daughter given list of facilities    Gwinda Maine 09/27/2011, 9:29 AM

## 2011-09-27 NOTE — Discharge Instructions (Signed)
   Supplemental Discharge Instructions for  Pacemaker Patients  Activity No heavy lifting or vigorous activity with your left/right arm for 6 to 8 weeks.  Do not raise your left/right arm above your head for one week.  Gradually raise your affected arm as drawn below.              09/29/11                             09/30/11                    10/01/11                        10/02/11  NO DRIVING unless cleared by your doctor WOUND CARE   Keep the wound area clean and dry.  Do not get this area wet for one week. No showers for one week; you may shower on 10/02/11   The tape/steri-strips on your wound will fall off; do not pull them off.  No bandage is needed on the site.  DO  NOT apply any creams, oils, or ointments to the wound area.   If you notice any drainage or discharge from the wound, any swelling or bruising at the site, or you develop a fever > 101? F after you are discharged home, call the office at once.  Special Instructions   You are still able to use cellular telephones; use the ear opposite the side where you have your pacemaker/defibrillator.  Avoid carrying your cellular phone near your device.   When traveling through airports, show security personnel your identification card to avoid being screened in the metal detectors.  Ask the security personnel to use the hand wand.   Avoid arc welding equipment, MRI testing (magnetic resonance imaging), TENS units (transcutaneous nerve stimulators).  Call the office for questions about other devices.   Avoid electrical appliances that are in poor condition or are not properly grounded.   Microwave ovens are safe to be near or to operate.

## 2011-09-28 LAB — BASIC METABOLIC PANEL
BUN: 18 mg/dL (ref 6–23)
Calcium: 8.4 mg/dL (ref 8.4–10.5)
GFR calc Af Amer: >90 mL/min (ref >90)
GFR calc non Af Amer: 80 mL/min — ABNORMAL LOW (ref >90)
Glucose, Bld: 106 mg/dL — ABNORMAL HIGH (ref 70–99)
Potassium: 3.9 mEq/L (ref 3.5–5.1)
Sodium: 131 mEq/L — ABNORMAL LOW (ref 135–145)

## 2011-09-28 MED ORDER — HYDRALAZINE HCL 25 MG PO TABS: 25.0000 mg | ORAL_TABLET | Freq: Three times a day (TID) | ORAL | Status: DC

## 2011-09-28 MED ORDER — POTASSIUM CHLORIDE CRYS ER 20 MEQ PO TBCR: 20.0000 meq | EXTENDED_RELEASE_TABLET | Freq: Every day | ORAL | Status: DC

## 2011-09-28 MED ORDER — ENOXAPARIN SODIUM 30 MG/0.3ML ~~LOC~~ SOLN: 40.0000 mg | SUBCUTANEOUS | Status: DC

## 2011-09-28 MED ORDER — FUROSEMIDE 20 MG PO TABS: 20.0000 mg | ORAL_TABLET | Freq: Every day | ORAL | Status: DC

## 2011-09-28 MED ORDER — AMLODIPINE BESYLATE 2.5 MG PO TABS: 5.0000 mg | ORAL_TABLET | Freq: Every day | ORAL | Status: DC

## 2011-09-28 MED ORDER — HYDROCODONE-ACETAMINOPHEN 5-325 MG PO TABS: 1.0000 | ORAL_TABLET | ORAL | Status: AC | PRN

## 2011-09-28 MED ORDER — CIPROFLOXACIN HCL 500 MG PO TABS: 500.0000 mg | ORAL_TABLET | Freq: Two times a day (BID) | ORAL | Status: AC

## 2011-09-28 NOTE — Progress Notes (Signed)
Clinical Social Work Department CLINICAL SOCIAL WORK PLACEMENT NOTE 09/28/2011  Patient:  Kathryn [name], Kathryn [name]  Account Number:  [id number] Admit date:  09/23/2011  Clinical Social Worker:  Unk Lightning, LCSW  Date/time:  09/28/2011 04:30 PM  Clinical Social Work is seeking post-discharge placement for this patient at the following level of care:   SKILLED NURSING   (*CSW will update this form in Epic as items are completed)   09/25/2011  Patient/family provided with [facility] Department of Clinical Social Work's list of facilities offering this level of care within the geographic area requested by the patient (or if unable, by the patient's family).  09/25/2011  Patient/family informed of their freedom to choose among providers that offer the needed level of care, that participate in Medicare, Medicaid or managed care program needed by the patient, have an available bed and are willing to accept the patient.  09/25/2011  Patient/family informed of [facility]' ownership interest in [facility], as well as of the fact that they are under no obligation to receive care at this facility.  PASARR submitted to EDS on 09/25/2011 PASARR number received from EDS on 09/28/2011  FL2 transmitted to all facilities in geographic area requested by pt/family on  09/25/2011 FL2 transmitted to all facilities within larger geographic area on   Patient informed that his/her managed care company has contracts with or will negotiate with  certain facilities, including the following:     Patient/family informed of bed offers received:  09/28/2011 Patient chooses bed at [facility] Physician recommends and patient chooses bed at    Patient to be transferred to [facility] on  09/28/2011 Patient to be transferred to facility by [facility]  The following physician request were entered in Epic:   Additional Comments:

## 2011-09-28 NOTE — Progress Notes (Signed)
PT Cancellation Note  Treatment cancelled today due to patient's refusal to participate Attempted to see pt twice.  First attempt, pt admitting to be anxious, wanting to talk with granddaughter, unable to reach granddaughter on phone.  Pt continued to refuse.  Second attempt, pt immediately told therapist she had talked with the granddaughter and that she was not going to do any therapy today, unable to change pt's mind even with explaining purpose of therapy.  Pt stating she was tired of being messed with.  Georges Mouse 09/28/2011, 10:38 AM

## 2011-09-28 NOTE — Discharge Summary (Signed)
Physician Discharge Summary  Patient ID: Kathryn [name] MRN: [id number] DOB/AGE: [birth year] 76 y.o.  Admit date: 09/23/2011 Discharge date: 09/28/2011  PCP: Sissy Hoff, MD, MD  Discharge Diagnoses:  Principal Problem:  *Heart block Active Problems:  Hypertension  Fracture, intertrochanteric, right femur  Hyperlipidemia  Tobacco use  Hyperthyroidism  History of cardiac arrest  Recommendations: Check BMET in 1 week. Remove foley when patient is more mobile.  Discharged Condition: fair  Initial History: 78 y/o female admitted after a fall and fracture of right hip and found to have 3rd degree heart block and uncontrolled BP. Bp was initially controlled with Nitro drip, PO and PRN IV Hydralazine. She underwent Pacer placement and ORIF on 3/28.   Hospital Course:   Heart block- complete/ History of cardiac arrest  This happened while she in the ED. Cardiology was consulted and she underwent a permanent pacemaker placement. Cardiology will follow as OP. Instructions regarding use of left arm provided.   Hypertension  BP improved but not completely controlled. Improved with increasing dose of Amlodipine. Please continue to monitor and adjust meds as needed.  Fracture, intertrochanteric, right femur  Stable S/p ORIF. PT/OT will be started. Will need f/u with Dr. Rennis Chris. DVT prophylaxis for 3 weeks.  Pulm HTN with Mod to severe TR and Pulm Regurg - noted on ECHO this admission  Stable   Pulmonary edema and hypoxia (see CXR)  Improved with lasix. CXR shows improvement on 3/31. Lasix dose reduced and changed to oral tablet. Potassium to be given as well. No systolic or diatolic dysfunction on ECHO. Monitor BMet at SNF.  UTI- e. Coli  On oral cipro. Did order discontinuation of foley but patient is refusing. She wants to be more mobile before taking this out. Have explained to her that leaving the catheter in may lead to more infections. She understands but still wants  to leave it in.  Anemia  H/H stable. No overt bleeding noted.   Hyperlipidemia  Stable   Tobacco use  Tobacco cessation counseling done.   Hyperthyroidism  Tapazol   Overall patient has done very well. Is stable from cardiac standpoint. Await placement to SNF for rehab.  PERTINENT LABS  Sodium was 130. Hgb stable between 8 and 9. TSh was 1.73.   IMAGING STUDIES Dg Chest 1 View  09/23/2011  *RADIOLOGY REPORT*  Clinical Data: Right femoral neck fracture.  Preoperative respiratory evaluation.  CHEST - 1 VIEW 09/23/2011:  Comparison: None.  Findings: Cardiac silhouette moderately enlarged.  Prominent paracardiac fat pad on the left.  Thoracic aorta atherosclerotic. Hilar and mediastinal contours otherwise unremarkable.  Prominent bronchovascular markings diffusely and moderate central peribronchial thickening.  No localized airspace consolidation.  No evidence of interstitial pulmonary edema.  No visible pleural effusions.  Multiple left rib fractures, generalized osteopenia, and degenerative changes in the shoulder joints.  IMPRESSION: Moderate cardiomegaly without pulmonary edema.  Moderate changes of bronchitis and/or asthma, likely chronic.  No convincing evidence of acute cardiopulmonary disease.  Original Report Authenticated By: Arnell Sieving, M.D.   Dg Hip Complete Right  09/23/2011  *RADIOLOGY REPORT*  Clinical Data: Fall with right hip pain.  RIGHT HIP - COMPLETE 2+ VIEW  Comparison: None.  Findings: There is an intertrochanteric fracture of the right femur with varus angulation of the fracture fragments.  Mild degenerative changes in both hips.  No dislocation.  Osteopenia.  Degenerative changes in the visualized portion of the spine.  Vascular calcifications.  IMPRESSION:  1.  Intertrochanteric right  femur fracture. Per CMS PQRS reporting requirements (PQRS Measure 24): Given the patient's age of greater than 50 and the fracture site (hip, distal radius, or spine), the patient  should be tested for osteoporosis using DXA, and the appropriate treatment considered based on the DXA results. 2.  Osteopenia. 3.  Spondylosis.  Original Report Authenticated By: Reyes Ivan, M.D.   Dg Hip Operative Right  09/24/2011  *RADIOLOGY REPORT*  Clinical Data: Fracture fixation.  OPERATIVE RIGHT HIP  Comparison: Plain films right hip 09/23/2011.  Findings: We are provided with four fluoroscopic spot views of the right hip.  Images demonstrate a dynamic hip screw and short IM nail with a single distal interlocking screw for fixation of an intertrochanteric fracture.  Position and alignment appear near anatomic.  No new abnormality.  IMPRESSION: ORIF right intertrochanteric fracture.  Original Report Authenticated By: Bernadene Bell. Maricela Curet, M.D.   Ct Head Wo Contrast  09/23/2011  *RADIOLOGY REPORT*  Clinical Data:  Larey Seat.  Found down.  CT HEAD WITHOUT CONTRAST CT CERVICAL SPINE WITHOUT CONTRAST  Technique:  Multidetector CT imaging of the head and cervical spine was performed following the standard protocol without intravenous contrast.  Multiplanar CT image reconstructions of the cervical spine were also generated.  Comparison:  None  CT HEAD  Findings: Age related cerebral atrophy, ventriculomegaly and periventricular white matter disease.  Remote appearing lacunar type infarct in the right basal ganglia region and mild ex vacuo dilatation of the frontal horn the right lateral ventricle.  No CT findings for acute hemispheric infarction and/or intracranial hemorrhage.  No extra-axial fluid collections are identified.  The brainstem and cerebellum grossly normal.  The bony structures are intact.  No skull fracture.  The paranasal sinuses and mastoid air cells are clear except for minimal debris in the right frontal sinus.  Osteoporotic changes involving the skull.  IMPRESSION:  1.  Age related cerebral atrophy, ventriculomegaly and periventricular white matter disease. 2.  Remote lacunar type basal  ganglia infarcts. 3.  No skull fracture or subdural hematoma.  CT CERVICAL SPINE  Findings: Advanced degenerative cervical spondylosis with disc disease and facet disease.  The overall alignment is maintained. Mild multilevel degenerative subluxations.  The facets are normally aligned.  Advanced facet degenerative changes.  The skull base C1 and C1-2 articulations are maintained.  The dens is intact.  The lung apices are clear.  IMPRESSION:  1.  Advanced degenerative cervical spondylosis with disc disease and facet disease. 2.  No acute cervical spine fracture.  Original Report Authenticated By: P. Loralie Champagne, M.D.   Ct Cervical Spine Wo Contrast  09/23/2011  *RADIOLOGY REPORT*  Clinical Data:  Larey Seat.  Found down.  CT HEAD WITHOUT CONTRAST CT CERVICAL SPINE WITHOUT CONTRAST  Technique:  Multidetector CT imaging of the head and cervical spine was performed following the standard protocol without intravenous contrast.  Multiplanar CT image reconstructions of the cervical spine were also generated.  Comparison:  None  CT HEAD  Findings: Age related cerebral atrophy, ventriculomegaly and periventricular white matter disease.  Remote appearing lacunar type infarct in the right basal ganglia region and mild ex vacuo dilatation of the frontal horn the right lateral ventricle.  No CT findings for acute hemispheric infarction and/or intracranial hemorrhage.  No extra-axial fluid collections are identified.  The brainstem and cerebellum grossly normal.  The bony structures are intact.  No skull fracture.  The paranasal sinuses and mastoid air cells are clear except for minimal debris in the right frontal sinus.  Osteoporotic changes involving the skull.  IMPRESSION:  1.  Age related cerebral atrophy, ventriculomegaly and periventricular white matter disease. 2.  Remote lacunar type basal ganglia infarcts. 3.  No skull fracture or subdural hematoma.  CT CERVICAL SPINE  Findings: Advanced degenerative cervical spondylosis  with disc disease and facet disease.  The overall alignment is maintained. Mild multilevel degenerative subluxations.  The facets are normally aligned.  Advanced facet degenerative changes.  The skull base C1 and C1-2 articulations are maintained.  The dens is intact.  The lung apices are clear.  IMPRESSION:  1.  Advanced degenerative cervical spondylosis with disc disease and facet disease. 2.  No acute cervical spine fracture.  Original Report Authenticated By: P. Loralie Champagne, M.D.   Dg Chest Port 1 View  09/27/2011  *RADIOLOGY REPORT*  Clinical Data: Pulmonary edema.  PORTABLE CHEST - 1 VIEW  Comparison: 09/25/2011  Findings: Stable left subclavian transvenous pacemaker.  Stable cardiomegaly.  Interval improvement in the pulmonary vascular congestion and mild perihilar/bibasilar interstitial edema or infiltrates.  Cannot exclude small pleural effusions.  Atheromatous aortic arch.  IMPRESSION:  1.  Interval improvement in pulmonary vascular congestion and mild interstitial edema/infiltrates.  Original Report Authenticated By: Osa Craver, M.D.   Dg Chest Portable 1 View  09/25/2011  *RADIOLOGY REPORT*  Clinical Data: Postop pacemaker placement.  PORTABLE CHEST - 1 VIEW  Comparison: 09/23/2011  Findings: Patient is rotated towards the left side.  There is now a dual lead left cardiac pacemaker.  The ventricular lead is not completely imaged.  There are new basilar densities which may represent dependent edema and/or effusions.  No evidence for a large pneumothorax.  The heart size appears to be grossly stable.  IMPRESSION: Placement of a left cardiac pacemaker as described.  No evidence for a pneumothorax  Increased basilar densities may represent dependent edema and small effusions.  Original Report Authenticated By: Richarda Overlie, M.D.   2D ECHO Study Conclusions  - Left ventricle: The cavity size was normal. Wall thickness was normal. Systolic function was normal. The estimated ejection  fraction was in the range of 55% to 60%. - Mitral valve: Calcified annulus. Mild to moderate regurgitation. - Left atrium: The atrium was moderately dilated. - Right atrium: The atrium was moderately dilated. - Atrial septum: No defect or patent foramen ovale was identified. - Tricuspid valve: Moderate-severe regurgitation. - Pulmonic valve: Moderate regurgitation. - Pulmonary arteries: PA peak pressure: 67mm Hg (S).   Discharge Exam: Blood pressure 173/77, pulse 78, temperature 98.6 F (37 C), temperature source Oral, resp. rate 19, height 5' (1.524 m), weight 43.9 kg (96 lb 12.5 oz), SpO2 91.00%. General appearance: alert, cooperative and no distress Resp: clear to auscultation bilaterally Cardio: regular rate and rhythm, S1, S2 normal, no murmur, click, rub or gallop GI: soft, non-tender; bowel sounds normal; no masses,  no organomegaly Extremities: extremities normal, atraumatic, no cyanosis or edema Skin: Skin color, texture, turgor normal. No rashes or lesions Neurologic: Grossly normal  Disposition: to SNF  Discharge Orders    Future Orders Please Complete By Expires   Diet - low sodium heart healthy      Increase activity slowly      Discharge instructions      Comments:   Please remove foley catheter when patient is more mobile.     Current Discharge Medication List    START taking these medications   Details  ciprofloxacin (CIPRO) 500 MG tablet Take 1 tablet (500 mg total) by mouth  2 (two) times daily. For 4 days    enoxaparin (LOVENOX) 30 MG/0.3ML injection Inject 0.4 mLs (40 mg total) into the skin daily. For 3 weeks Qty: 0 Syringe    furosemide (LASIX) 20 MG tablet Take 1 tablet (20 mg total) by mouth daily. Qty: 30 tablet    hydrALAZINE (APRESOLINE) 25 MG tablet Take 1 tablet (25 mg total) by mouth every 8 (eight) hours.    HYDROcodone-acetaminophen (NORCO) 5-325 MG per tablet Take 1-2 tablets by mouth every 4 (four) hours as needed for pain. Qty: 30  tablet, Refills: 0    potassium chloride SA (K-DUR,KLOR-CON) 20 MEQ tablet Take 1 tablet (20 mEq total) by mouth daily.      CONTINUE these medications which have CHANGED   Details  amLODipine (NORVASC) 2.5 MG tablet Take 2 tablets (5 mg total) by mouth daily.      CONTINUE these medications which have NOT CHANGED   Details  aspirin EC 325 MG tablet Take 325 mg by mouth daily.    Calcium-Vitamin D 600-200 MG-UNIT per tablet Take 1 tablet by mouth daily.    fish oil-omega-3 fatty acids 1000 MG capsule Take 1 g by mouth daily.    lisinopril (PRINIVIL,ZESTRIL) 20 MG tablet Take 40 mg by mouth daily.    methimazole (TAPAZOLE) 5 MG tablet Take 5 mg by mouth daily.    Multiple Vitamins-Minerals (OCUVITE PRESERVISION PO) Take 1 tablet by mouth daily.    pyridOXINE (VITAMIN B-6) 100 MG tablet Take 100 mg by mouth daily.    sertraline (ZOLOFT) 50 MG tablet Take 50 mg by mouth daily.    simvastatin (ZOCOR) 20 MG tablet Take 20 mg by mouth every evening.       Follow-up Information    Follow up with Lewayne Bunting, MD. (Our office will call you to schedule your wound check & follow-up appointment)    Contact information:   [address] [place] [address] [phone]       Follow up with Sissy Hoff, MD. Schedule an appointment as soon as possible for a visit in 3 weeks.   Contact information:   [address] [place] [address] [phone]       Follow up with Senaida Lange, MD. Schedule an appointment as soon as possible for a visit in 2 weeks. (post hip repair follow up)    Contact information:   [facility] [address], [address] [place] [address] [phone]          Total Discharge Time: 35 mins  [facility]  [facility] Pager [phone]  09/28/2011, 11:02 AM

## 2011-09-28 NOTE — Progress Notes (Signed)
CSW spoke with granddaughter who chose [facility]. CSW faxed dc summary to SNF and prepared dc packet. CSW informed patient, granddaughter and RN of dc and all were agreeable. CSW coordinated transportation via [place]. CSW is signing off. [place], [place] [phone]

## 2011-09-28 NOTE — Progress Notes (Signed)
CSW spoke with granddaughter regarding bed offers. Granddaughter had further questions regarding SNF such as private room and copays. CSW received further information for family. Granddaughter agreed to speak with family and call CSW back with SNF choice. CSW will continue to follow. [place], [place] [phone]

## 2011-09-28 NOTE — Progress Notes (Signed)
Patient has refused to have foley taken out.  MD made aware.  Patient wishes to go to SNF with foley in place and take out later on once she is more mobile.

## 2011-10-08 ENCOUNTER — Ambulatory Visit: Payer: Medicare Other

## 2011-10-14 ENCOUNTER — Emergency Department (HOSPITAL_COMMUNITY): Payer: Medicare Other

## 2011-10-14 ENCOUNTER — Emergency Department (HOSPITAL_COMMUNITY)
Admission: EM | Admit: 2011-10-14 | Discharge: 2011-10-14 | Disposition: A | Discharge status: Home / Self Care | Payer: Medicare Other | Attending: Emergency Medicine | Admitting: Emergency Medicine

## 2011-10-14 ENCOUNTER — Encounter (HOSPITAL_COMMUNITY): Payer: Self-pay

## 2011-10-14 ENCOUNTER — Other Ambulatory Visit: Payer: Self-pay

## 2011-10-14 DIAGNOSIS — S0003XA Contusion of scalp, initial encounter: Secondary | ICD-10-CM | POA: Insufficient documentation

## 2011-10-14 DIAGNOSIS — W19XXXA Unspecified fall, initial encounter: Secondary | ICD-10-CM | POA: Insufficient documentation

## 2011-10-14 DIAGNOSIS — I679 Cerebrovascular disease, unspecified: Secondary | ICD-10-CM | POA: Insufficient documentation

## 2011-10-14 DIAGNOSIS — Z8739 Personal history of other diseases of the musculoskeletal system and connective tissue: Secondary | ICD-10-CM | POA: Insufficient documentation

## 2011-10-14 DIAGNOSIS — I10 Essential (primary) hypertension: Secondary | ICD-10-CM | POA: Insufficient documentation

## 2011-10-14 DIAGNOSIS — F329 Major depressive disorder, single episode, unspecified: Secondary | ICD-10-CM | POA: Insufficient documentation

## 2011-10-14 DIAGNOSIS — Z79899 Other long term (current) drug therapy: Secondary | ICD-10-CM | POA: Insufficient documentation

## 2011-10-14 DIAGNOSIS — E041 Nontoxic single thyroid nodule: Secondary | ICD-10-CM | POA: Insufficient documentation

## 2011-10-14 DIAGNOSIS — E785 Hyperlipidemia, unspecified: Secondary | ICD-10-CM | POA: Insufficient documentation

## 2011-10-14 DIAGNOSIS — F3289 Other specified depressive episodes: Secondary | ICD-10-CM | POA: Insufficient documentation

## 2011-10-14 DIAGNOSIS — S0990XA Unspecified injury of head, initial encounter: Secondary | ICD-10-CM | POA: Insufficient documentation

## 2011-10-14 LAB — DIFFERENTIAL
Eosinophils Absolute: 0.6 10*3/uL (ref 0.0–0.7)
Eosinophils Relative: 7 % — ABNORMAL HIGH (ref 0–5)
Lymphs Abs: 0.8 10*3/uL (ref 0.7–4.0)
Monocytes Absolute: 0.5 10*3/uL (ref 0.1–1.0)
Monocytes Relative: 7 % (ref 3–12)

## 2011-10-14 LAB — COMPREHENSIVE METABOLIC PANEL
BUN: 27 mg/dL — ABNORMAL HIGH (ref 6–23)
Calcium: 9.9 mg/dL (ref 8.4–10.5)
Creatinine, Ser: 0.74 mg/dL (ref 0.50–1.10)
GFR calc Af Amer: 87 mL/min — ABNORMAL LOW (ref >90)
Glucose, Bld: 102 mg/dL — ABNORMAL HIGH (ref 70–99)
Total Protein: 6.4 g/dL (ref 6.0–8.3)

## 2011-10-14 LAB — CBC
HCT: 31.6 % — ABNORMAL LOW (ref 36.0–46.0)
Hemoglobin: 10.7 g/dL — ABNORMAL LOW (ref 12.0–15.0)
MCH: 30.7 pg (ref 26.0–34.0)
MCV: 90.5 fL (ref 78.0–100.0)
Platelets: 273 10*3/uL (ref 150–400)
RBC: 3.49 MIL/uL — ABNORMAL LOW (ref 3.87–5.11)

## 2011-10-14 LAB — TROPONIN I: Troponin I: <0.3 ng/mL (ref <0.30)

## 2011-10-14 NOTE — ED Notes (Signed)
Patient transported to CT by transporter 

## 2011-10-14 NOTE — ED Notes (Signed)
Resting with eyes closed, nad noted, awaiting furhtur orders.

## 2011-10-14 NOTE — ED Notes (Signed)
Pt here for fall sts was going to bathroom and just fell. Has abrasion to left corner of eye and left hand has bruising noted.

## 2011-10-14 NOTE — ED Notes (Signed)
Placed on bedpan and then dressed for discharge, pt given food and coffee per request.

## 2011-10-14 NOTE — ED Notes (Signed)
Patient denies pain and is resting comfortably.  

## 2011-10-14 NOTE — Discharge Instructions (Signed)
Follow up with Dr Jearld Fenton next week for your left facial fracture

## 2011-10-14 NOTE — ED Notes (Signed)
Dr. zammit at bedside 

## 2011-10-14 NOTE — ED Provider Notes (Signed)
History     CSN: [id number]  Arrival date & time 10/14/11  7829   First MD Initiated Contact with Patient 10/14/11 [phone]      Chief Complaint  Patient presents with  . Fall    (Consider location/radiation/quality/duration/timing/severity/associated sxs/prior treatment) Patient is a 78 y.o. female presenting with fall. The history is provided by the patient (the pt was walking and fell onto her face.  possible loc). No language interpreter was used.  Fall The accident occurred 1 to 2 hours ago. The fall occurred while walking. She fell from a height of 1 to 2 ft. She landed on a hard floor. The point of impact was the head. The pain is present in the head. The pain is at a severity of 1/10. The pain is mild. She was not ambulatory at the scene. There was no entrapment after the fall. There was no drug use involved in the accident. There was no alcohol use involved in the accident. Pertinent negatives include no visual change, no abdominal pain, no hematuria and no headaches. Exacerbated by: nothing. Prehospitalization: none. The treatment provided no relief.    Past Medical History  Diagnosis Date  . Hypertension   . Hyperlipemia   . Hyperthyroidism   . Arthritis   . Depression   . Intertrochanteric fracture of right femur 09/22/11    right; S/P fall  . Non-sustained ventricular tachycardia   . Third degree AV block   . Cerebrovascular disease     80 % left ICA  . Macular degeneration     Substantial visual impairment  . Peripheral vascular disease     Femoral bruit; decreased distal pulses    Past Surgical History  Procedure Date  . Appendectomy 1943  . Laryngeal nodule ~ 1960's    removed    History reviewed. No pertinent family history.  History  Substance Use Topics  . Smoking status: Former Smoker -- 1.0 packs/day for 66 years    Types: Cigarettes    Quit date: 06/21/2011  . Smokeless tobacco: Never Used  . Alcohol Use: No    OB History    Grav Para Term  Preterm Abortions TAB SAB Ect Mult Living                  Review of Systems  Constitutional: Negative for fatigue.  HENT: Negative for congestion, sinus pressure and ear discharge.   Eyes: Negative for discharge.  Respiratory: Negative for cough.   Cardiovascular: Negative for chest pain.  Gastrointestinal: Negative for abdominal pain and diarrhea.  Genitourinary: Negative for frequency and hematuria.  Musculoskeletal: Negative for back pain.  Skin: Negative for rash.  Neurological: Negative for seizures and headaches.  Hematological: Negative.   Psychiatric/Behavioral: Negative for hallucinations.    Allergies  Review of patient's allergies indicates no known allergies.  Home Medications   Current Outpatient Rx  Name Route Sig Dispense Refill  . AMLODIPINE BESYLATE 5 MG PO TABS Oral Take 5 mg by mouth daily.    . ASPIRIN EC 325 MG PO TBEC Oral Take 325 mg by mouth daily.    Marland Kitchen CALCIUM-VITAMIN D 600-200 MG-UNIT PO TABS Oral Take 1 tablet by mouth daily.    Marland Kitchen ENOXAPARIN SODIUM 30 MG/0.3ML [place] SOLN Subcutaneous Inject 30 mg into the skin daily.    . OMEGA-3 FATTY ACIDS 1000 MG PO CAPS Oral Take 1 g by mouth daily.    . FUROSEMIDE 40 MG PO TABS Oral Take 40 mg by mouth daily.    Marland Kitchen  HYDRALAZINE HCL 25 MG PO TABS Oral Take 1 tablet (25 mg total) by mouth every 8 (eight) hours.    Marland Kitchen HYDROCODONE-ACETAMINOPHEN 5-325 MG PO TABS Oral Take 1 tablet by mouth every 6 (six) hours as needed. For pain    . LISINOPRIL 20 MG PO TABS Oral Take 40 mg by mouth daily.    Marland Kitchen METHIMAZOLE 5 MG PO TABS Oral Take 5 mg by mouth daily.    Idolina Primer PRESERVISION PO Oral Take 1 tablet by mouth daily.    Marland Kitchen POTASSIUM CHLORIDE CRYS ER 10 MEQ PO TBCR Oral Take 20 mEq by mouth daily.    Marland Kitchen VITAMIN B-6 100 MG PO TABS Oral Take 100 mg by mouth daily.    . SERTRALINE HCL 50 MG PO TABS Oral Take 50 mg by mouth daily.    Marland Kitchen SIMVASTATIN 20 MG PO TABS Oral Take 20 mg by mouth every evening.      BP 155/56  Pulse 76   Resp 20  SpO2 98%  Physical Exam  Constitutional: She is oriented to person, place, and time. She appears well-developed.  HENT:  Head: Normocephalic.       Tenderness with small abrasion to left face  Eyes: Conjunctivae and EOM are normal. No scleral icterus.  Neck: Neck supple. No thyromegaly present.  Cardiovascular: Normal rate and regular rhythm.  Exam reveals no gallop and no friction rub.   No murmur heard. Pulmonary/Chest: No stridor. She has no wheezes. She has no rales. She exhibits no tenderness.  Abdominal: She exhibits no distension. There is no tenderness. There is no rebound.  Musculoskeletal: Normal range of motion. She exhibits no edema.       Healed scar right hip  Lymphadenopathy:    She has no cervical adenopathy.  Neurological: She is oriented to person, place, and time. Coordination normal.  Skin: No rash noted. No erythema.  Psychiatric: She has a normal mood and affect. Her behavior is normal.    ED Course  Procedures (including critical care time)  Labs Reviewed  CBC - Abnormal; Notable for the following:    RBC 3.49 (*)    Hemoglobin 10.7 (*)    HCT 31.6 (*)    All other components within normal limits  DIFFERENTIAL - Abnormal; Notable for the following:    Lymphocytes Relative 10 (*)    Eosinophils Relative 7 (*)    All other components within normal limits  COMPREHENSIVE METABOLIC PANEL - Abnormal; Notable for the following:    Sodium 134 (*)    Glucose, Bld 102 (*)    BUN 27 (*)    Albumin 3.2 (*)    AST 43 (*)    Alkaline Phosphatase 149 (*)    GFR calc non Af Amer 75 (*)    GFR calc Af Amer 87 (*)    All other components within normal limits  TROPONIN I   Ct Head Wo Contrast  10/14/2011  *RADIOLOGY REPORT*  Clinical Data:  Status post fall, small laceration with swelling/bruising to right periorbital soft tissues  CT HEAD WITHOUT CONTRAST CT CERVICAL SPINE WITHOUT CONTRAST  Technique:  Multidetector CT imaging of the head and cervical  spine was performed following the standard protocol without intravenous contrast.  Multiplanar CT image reconstructions of the cervical spine were also generated.  Comparison:  09/23/2011.  CT HEAD  Findings: No evidence of parenchymal hemorrhage or extra-axial fluid collection. No mass lesion, mass effect, or midline shift.  No CT evidence of acute  infarction.  Right basal ganglia lacunar infarct.  Extensive subcortical white matter and periventricular small vessel ischemic changes. Intracranial atherosclerosis.  Global cortical atrophy with secondary ventricular prominence.  Ex vacuo dilatation of the frontal horn of the right lateral ventricle.  Fluid/hemorrhage in the left maxillary sinus, new.  Suspected nondisplaced fracture involving the left orbital floor/anterior wall of the left maxillary sinus (series 4/image 3).  The visualized paranasal sinuses are otherwise clear.  No additional fracture is seen.  IMPRESSION: Fluid/hemorrhage in the left maxillary sinus, new.  Suspected nondisplaced fracture involving the left orbital floor/anterior wall of the left maxillary sinus.  Otherwise unchanged from recent CT.  These results were called by telephone on 10/14/2011  at  0804 hours to  Dr. Bethann Berkshire, who verbally acknowledged these results.  CT CERVICAL SPINE  Findings: Exaggerated cervical lordosis.  No evidence of fracture dislocation.  Vertebral body heights are maintained.  The dens appears intact.  No prevertebral soft tissue swelling.  Moderate multilevel degenerative changes.  Bilateral thyroid nodules with calcifications.  Visualized lung apices are clear.  IMPRESSION: No fracture or dislocation is seen.  Moderate multilevel degenerative changes.  Original Report Authenticated By: Charline Bills, M.D.   Ct Cervical Spine Wo Contrast  10/14/2011  *RADIOLOGY REPORT*  Clinical Data:  Status post fall, small laceration with swelling/bruising to right periorbital soft tissues  CT HEAD WITHOUT CONTRAST  CT CERVICAL SPINE WITHOUT CONTRAST  Technique:  Multidetector CT imaging of the head and cervical spine was performed following the standard protocol without intravenous contrast.  Multiplanar CT image reconstructions of the cervical spine were also generated.  Comparison:  09/23/2011.  CT HEAD  Findings: No evidence of parenchymal hemorrhage or extra-axial fluid collection. No mass lesion, mass effect, or midline shift.  No CT evidence of acute infarction.  Right basal ganglia lacunar infarct.  Extensive subcortical white matter and periventricular small vessel ischemic changes. Intracranial atherosclerosis.  Global cortical atrophy with secondary ventricular prominence.  Ex vacuo dilatation of the frontal horn of the right lateral ventricle.  Fluid/hemorrhage in the left maxillary sinus, new.  Suspected nondisplaced fracture involving the left orbital floor/anterior wall of the left maxillary sinus (series 4/image 3).  The visualized paranasal sinuses are otherwise clear.  No additional fracture is seen.  IMPRESSION: Fluid/hemorrhage in the left maxillary sinus, new.  Suspected nondisplaced fracture involving the left orbital floor/anterior wall of the left maxillary sinus.  Otherwise unchanged from recent CT.  These results were called by telephone on 10/14/2011  at  0804 hours to  Dr. Bethann Berkshire, who verbally acknowledged these results.  CT CERVICAL SPINE  Findings: Exaggerated cervical lordosis.  No evidence of fracture dislocation.  Vertebral body heights are maintained.  The dens appears intact.  No prevertebral soft tissue swelling.  Moderate multilevel degenerative changes.  Bilateral thyroid nodules with calcifications.  Visualized lung apices are clear.  IMPRESSION: No fracture or dislocation is seen.  Moderate multilevel degenerative changes.  Original Report Authenticated By: Charline Bills, M.D.     1. Head injury      Date: 10/14/2011  Rate:72  Rhythm: normal sinus rhythm  QRS Axis:  left  Intervals: normal  ST/T Wave abnormalities: nonspecific ST changes  Conduction Disutrbances:left bundle branch block  Narrative Interpretation:   Old EKG Reviewed: none available    MDM          Benny Lennert, MD 10/14/11 1055

## 2011-10-14 NOTE — ED Notes (Signed)
Pt here for fall after trying to get out of bed, has skin tear to left face and left hand, denie [place].

## 2011-10-14 NOTE — ED Notes (Signed)
ptar called for transport 

## 2011-12-15 ENCOUNTER — Encounter: Payer: Self-pay | Admitting: Internal Medicine

## 2011-12-15 ENCOUNTER — Ambulatory Visit (INDEPENDENT_AMBULATORY_CARE_PROVIDER_SITE_OTHER): Payer: Medicare Other | Admitting: Internal Medicine

## 2011-12-15 VITALS — BP 108/56 | HR 76 | Ht 60.0 in | Wt 96.0 lb

## 2011-12-15 DIAGNOSIS — Z8674 Personal history of sudden cardiac arrest: Secondary | ICD-10-CM

## 2011-12-15 DIAGNOSIS — Z950 Presence of cardiac pacemaker: Secondary | ICD-10-CM | POA: Insufficient documentation

## 2011-12-15 DIAGNOSIS — I459 Conduction disorder, unspecified: Secondary | ICD-10-CM

## 2011-12-15 DIAGNOSIS — I10 Essential (primary) hypertension: Secondary | ICD-10-CM

## 2011-12-15 LAB — PACEMAKER DEVICE OBSERVATION
AL AMPLITUDE: 0.7 mv
AL THRESHOLD: 0.5 V
DEVICE MODEL PM: 126125
RV LEAD AMPLITUDE: 25 mv

## 2011-12-15 NOTE — Assessment & Plan Note (Signed)
Her device is working normally. We'll plan to recheck in several months. 

## 2011-12-15 NOTE — Patient Instructions (Signed)
Your physician wants you to follow-up in: March of 2013 You will receive a reminder letter in the mail two months in advance. If you don't receive a letter, please call our office to schedule the follow-up appointment.

## 2011-12-15 NOTE — Assessment & Plan Note (Signed)
Her blood pressure is well controlled. She will continue a low-sodium diet. She will continue her current medical therapy.

## 2011-12-15 NOTE — Progress Notes (Signed)
HPI Kathryn [name] returns today for followup. She is a very pleasant 78 year old woman with symptomatic bradycardia, hypertension, status post permanent pacemaker insertion. She denies chest pain or shortness of breath. No syncope. 3 months ago she fractured her hip and underwent surgical repair. Despite her advanced age, she is recovering nicely. She denies syncope. No peripheral edema. No cough or hemoptysis. No Known Allergies   Current Outpatient Prescriptions  Medication Sig Dispense Refill  . amLODipine (NORVASC) 5 MG tablet Take 5 mg by mouth daily.      Marland Kitchen aspirin EC 325 MG tablet Take 325 mg by mouth daily.      . Calcium-Vitamin D 600-200 MG-UNIT per tablet Take 1 tablet by mouth daily.      . fish oil-omega-3 fatty acids 1000 MG capsule Take 1 g by mouth daily.      . furosemide (LASIX) 40 MG tablet Take 40 mg by mouth daily.      . hydrALAZINE (APRESOLINE) 25 MG tablet Take 1 tablet (25 mg total) by mouth every 8 (eight) hours.      Marland Kitchen HYDROcodone-acetaminophen (NORCO) 5-325 MG per tablet Take 1 tablet by mouth every 6 (six) hours as needed. For pain      . lisinopril (PRINIVIL,ZESTRIL) 20 MG tablet Take 40 mg by mouth daily.      . methimazole (TAPAZOLE) 5 MG tablet Take 5 mg by mouth daily.      . Multiple Vitamins-Minerals (OCUVITE PRESERVISION PO) Take 1 tablet by mouth daily.      Marland Kitchen pyridOXINE (VITAMIN B-6) 100 MG tablet Take 100 mg by mouth daily.      . sertraline (ZOLOFT) 100 MG tablet Take 100 mg by mouth daily.      . simvastatin (ZOCOR) 20 MG tablet Take 20 mg by mouth every evening.      Marland Kitchen DISCONTD: sertraline (ZOLOFT) 50 MG tablet Take 50 mg by mouth daily.         Past Medical History  Diagnosis Date  . Hypertension   . Hyperlipemia   . Hyperthyroidism   . Arthritis   . Depression   . Intertrochanteric fracture of right femur 09/22/11    right; S/P fall  . Non-sustained ventricular tachycardia   . Third degree AV block   . Cerebrovascular disease     80 %  left ICA  . Macular degeneration     Substantial visual impairment  . Peripheral vascular disease     Femoral bruit; decreased distal pulses    ROS:   All systems reviewed and negative except as noted in the HPI.   Past Surgical History  Procedure Date  . Appendectomy 1943  . Laryngeal nodule ~ 1960's    removed     No family history on file.   History   Social History  . Marital Status: Single    Spouse Name: N/A    Number of Children: N/A  . Years of Education: N/A   Occupational History  . Not on file.   Social History Main Topics  . Smoking status: Former Smoker -- 1.0 packs/day for 66 years    Types: Cigarettes    Quit date: 06/21/2011  . Smokeless tobacco: Never Used  . Alcohol Use: No  . Drug Use: No  . Sexually Active: No   Other Topics Concern  . Not on file   Social History Narrative  . No narrative on file     BP 108/56  Pulse 76  Ht 5' (1.524  m)  Wt 96 lb (43.545 kg)  BMI 18.75 kg/m2  Physical Exam:  Frail appearing elderly woman, NAD HEENT: Unremarkable Neck:  No JVD, no thyromegally Lungs:  Clear with no wheezes, rales, or rhonchi. HEART:  Regular rate rhythm, no murmurs, no rubs, no clicks Abd:  soft, positive bowel sounds, no organomegally, no rebound, no guarding Ext:  2 plus pulses, no edema, no cyanosis, no clubbing Skin:  No rashes no nodules Neuro:  CN II through XII intact, motor grossly intact  DEVICE  Normal device function.  See PaceArt for details.   Assess/Plan:

## 2012-03-21 ENCOUNTER — Telehealth: Payer: Self-pay | Admitting: Internal Medicine

## 2012-03-21 MED ORDER — HYDRALAZINE HCL 25 MG PO TABS: 25.0000 mg | ORAL_TABLET | Freq: Three times a day (TID) | ORAL | Status: DC

## 2012-03-21 NOTE — Telephone Encounter (Signed)
Please return call to patient daughter [name] [phone] to discuss patient medication.

## 2012-03-21 NOTE — Telephone Encounter (Signed)
Needed refills on her grandmothers medications

## 2012-03-23 ENCOUNTER — Other Ambulatory Visit: Payer: Self-pay | Admitting: Internal Medicine

## 2012-03-23 MED ORDER — HYDRALAZINE HCL 25 MG PO TABS: 25.0000 mg | ORAL_TABLET | Freq: Three times a day (TID) | ORAL | Status: DC

## 2012-03-24 ENCOUNTER — Telehealth: Payer: Self-pay | Admitting: Internal Medicine

## 2012-03-24 NOTE — Telephone Encounter (Signed)
New Problem:    Called in needing a refill of hydrALAZINE (APRESOLINE) 25 MG tablet

## 2012-03-24 NOTE — Telephone Encounter (Signed)
F/u  Verified preferred pharmacy [facility] [address] Called to expedite this RX for patient.  Please return call to pharmacy [phone]

## 2012-03-25 ENCOUNTER — Other Ambulatory Visit: Payer: Self-pay | Admitting: Cardiology

## 2012-03-25 MED ORDER — HYDRALAZINE HCL 25 MG PO TABS: 25.0000 mg | ORAL_TABLET | Freq: Three times a day (TID) | ORAL | Status: DC

## 2012-03-25 NOTE — Telephone Encounter (Signed)
Called patient to let her know that I refilled her Hydralazine 25 mg to [facility] on [address] in .

## 2012-03-30 ENCOUNTER — Other Ambulatory Visit: Payer: Self-pay | Admitting: Cardiology

## 2012-03-30 MED ORDER — HYDRALAZINE HCL 25 MG PO TABS: 25.0000 mg | ORAL_TABLET | Freq: Three times a day (TID) | ORAL | Status: DC

## 2012-09-22 ENCOUNTER — Encounter: Payer: Medicare Other | Admitting: Cardiology

## 2012-09-23 ENCOUNTER — Ambulatory Visit (INDEPENDENT_AMBULATORY_CARE_PROVIDER_SITE_OTHER): Payer: Medicare Other | Admitting: Cardiology

## 2012-09-23 ENCOUNTER — Encounter: Payer: Self-pay | Admitting: Cardiology

## 2012-09-23 ENCOUNTER — Encounter: Payer: Self-pay | Admitting: Internal Medicine

## 2012-09-23 VITALS — BP 97/56 | HR 86 | Wt 109.0 lb

## 2012-09-23 DIAGNOSIS — Z950 Presence of cardiac pacemaker: Secondary | ICD-10-CM

## 2012-09-23 DIAGNOSIS — I442 Atrioventricular block, complete: Secondary | ICD-10-CM

## 2012-09-23 DIAGNOSIS — I10 Essential (primary) hypertension: Secondary | ICD-10-CM

## 2012-09-23 DIAGNOSIS — I459 Conduction disorder, unspecified: Secondary | ICD-10-CM

## 2012-09-23 LAB — PACEMAKER DEVICE OBSERVATION
AL AMPLITUDE: 3.1 mv
AL THRESHOLD: 0.4 V
DEVICE MODEL PM: 126125
RV LEAD IMPEDENCE PM: 808 Ohm
VENTRICULAR PACING PM: 100

## 2012-09-23 NOTE — Patient Instructions (Signed)
Your physician recommends that you follow-up with the Device [facility] on 03/27/2013 @ 11:30 am.  Your physician wants you to follow-up in: 12 months with Dr. Ladona Ridgel. You will receive a reminder letter in the mail two months in advance. If you don't receive a letter, please call our office to schedule the follow-up appointment.

## 2012-09-23 NOTE — Progress Notes (Signed)
ELECTROPHYSIOLOGY OFFICE NOTE  Patient ID: Kathryn [name] MRN: [id number], DOB/AGE: Mar 12, 1923   Date of Visit: 09/23/2012  Primary Physician: Sissy Hoff, MD Primary Cardiologist: Lewayne Bunting, MD Reason for Visit: EP/device follow-up  History of Present Illness  Kathryn [name] is an 79 year old woman with CHB s/p PPM implant March 2013, HTN and PVD who presents today for routine electrophysiology followup. She is accompanied by her granddaughter who assists with history questions. Since last being seen in our clinic, she reports she is doing well. She reports intermittent dizziness with standing which has been ongoing for years but states she has not had any symptoms in the last 3-4 weeks. She denies any other complaints/concerns. Today, she specifically denies chest pain or shortness of breath. She denies palpitations, near syncope or syncope. She denies LE swelling, orthopnea or PND.   Past Medical History Past Medical History  Diagnosis Date  . Hypertension   . Hyperlipemia   . Hyperthyroidism   . Arthritis   . Depression   . Intertrochanteric fracture of right femur 09/22/11    right; S/P fall  . Non-sustained ventricular tachycardia   . Third degree AV block   . Cerebrovascular disease     80 % left ICA  . Macular degeneration     Substantial visual impairment  . Peripheral vascular disease     Femoral bruit; decreased distal pulses    Past Surgical History Past Surgical History  Procedure Laterality Date  . Appendectomy  1943  . Laryngeal nodule  ~ 1960's    removed     Allergies/Intolerances No Known Allergies  Current Home Medications Current Outpatient Prescriptions  Medication Sig Dispense Refill  . amLODipine (NORVASC) 5 MG tablet Take 5 mg by mouth daily.      Marland Kitchen aspirin EC 325 MG tablet Take 325 mg by mouth daily.      . Calcium Carb-Cholecalciferol (CALCIUM 1000 + D PO) Take 2 tablets by mouth daily.      . fish oil-omega-3 fatty acids  1000 MG capsule Take 1 g by mouth daily.      . furosemide (LASIX) 40 MG tablet Take 40 mg by mouth daily.      . hydrALAZINE (APRESOLINE) 25 MG tablet Take 1 tablet (25 mg total) by mouth every 8 (eight) hours.  90 tablet  6  . lisinopril (PRINIVIL,ZESTRIL) 20 MG tablet Take 40 mg by mouth daily.      . methimazole (TAPAZOLE) 5 MG tablet Take 5 mg by mouth daily.      . Multiple Vitamins-Minerals (OCUVITE PRESERVISION PO) Take 1 tablet by mouth daily.      Marland Kitchen pyridOXINE (VITAMIN B-6) 100 MG tablet Take 100 mg by mouth daily.      . sertraline (ZOLOFT) 100 MG tablet Take 100 mg by mouth daily.      . simvastatin (ZOCOR) 20 MG tablet Take 20 mg by mouth every evening.       No current facility-administered medications for this visit.    Social History Social History  . Marital Status: Single   Social History Main Topics  . Smoking status: Former Smoker -- 1.00 packs/day for 66 years    Types: Cigarettes    Quit date: 06/21/2011  . Smokeless tobacco: Never Used  . Alcohol Use: No  . Drug Use: No   Review of Systems General: No chills, fever, night sweats or weight changes Cardiovascular: No chest pain, dyspnea on exertion, edema, orthopnea, palpitations, paroxysmal nocturnal  dyspnea Dermatological: No rash, lesions or masses Respiratory: No cough, dyspnea Urologic: No hematuria, dysuria Abdominal: No nausea, vomiting, diarrhea, bright red blood per rectum, melena, or hematemesis Neurologic: No visual changes, weakness, changes in mental status All other systems reviewed and are otherwise negative except as noted above.  Physical Exam Blood pressure 97/56, pulse 86, weight 109 lb (49.442 kg).  General: Well developed, well appearing, elderly 79 year old female in no acute distress. HEENT: Normocephalic, atraumatic. EOMs intact. Sclera nonicteric. Oropharynx clear.  Neck: Supple. No JVD. Lungs: Respirations regular and unlabored, CTA bilaterally. No wheezes, rales or  rhonchi. Heart: RRR. S1, S2 present. No murmurs, rub, S3 or S4. Abdomen: Soft, non-distended.  Extremities: No clubbing, cyanosis or edema.  Psych: Normal affect. Neuro: Alert and oriented X 3. Moves all extremities spontaneously.   Diagnostics Device interrogation today - Normal device function. Thresholds, sensing, impedances consistent with previous measurements. Device programmed to maximize longevity. Atrial burden <1%. 2 NSVT episodes, EGM consistent with NSVT, 11 beats in duration. Device programmed at appropriate safety margins. Histogram distribution appropriate for patient activity level.  Estimated longevity 8.5 years.   Assessment and Plan 1. Complete heart block s/p PPM implant Normal device function No programming changes made Return for device clinic visit/PPM check in 6 months Return for follow-up with Dr. Ladona Ridgel in one year 2. HTN BP low today; asymptomatic Instructed Ms. O'Sullivan's granddaughter to check her BP 2-3 times weekly at home for the next 1-2 weeks She will follow-up with her PCP If persistently low with symptoms of dizziness or fatigue, consider down-titrating amlodipine or hydralazine Also with her h/o PVD, would be important to measure in both arms in the future  Signed, Warden Buffa, PA-C 09/23/2012, 2:40 PM

## 2012-10-12 ENCOUNTER — Other Ambulatory Visit: Payer: Self-pay

## 2012-10-12 MED ORDER — HYDRALAZINE HCL 25 MG PO TABS: 25.0000 mg | ORAL_TABLET | Freq: Three times a day (TID) | ORAL | Status: DC

## 2012-10-24 ENCOUNTER — Other Ambulatory Visit: Payer: Self-pay | Admitting: *Deleted

## 2012-10-24 MED ORDER — HYDRALAZINE HCL 25 MG PO TABS: 25.0000 mg | ORAL_TABLET | Freq: Three times a day (TID) | ORAL | Status: DC

## 2012-10-24 NOTE — Telephone Encounter (Signed)
Pt granddaughter [name] called wanting pt meds sent to pharmacy. Fax Received. Refill Completed. Mahek Schlesinger Chowoe (R.M.A)

## 2012-10-28 ENCOUNTER — Other Ambulatory Visit: Payer: Self-pay | Admitting: *Deleted

## 2013-02-17 ENCOUNTER — Encounter: Payer: Self-pay | Admitting: Internal Medicine

## 2013-03-14 ENCOUNTER — Encounter: Payer: Self-pay | Admitting: Internal Medicine

## 2013-04-26 ENCOUNTER — Ambulatory Visit (INDEPENDENT_AMBULATORY_CARE_PROVIDER_SITE_OTHER): Payer: Medicare Other | Admitting: *Deleted

## 2013-04-26 DIAGNOSIS — I472 Ventricular tachycardia: Secondary | ICD-10-CM

## 2013-04-26 DIAGNOSIS — Z950 Presence of cardiac pacemaker: Secondary | ICD-10-CM

## 2013-04-26 DIAGNOSIS — I442 Atrioventricular block, complete: Secondary | ICD-10-CM

## 2013-04-26 LAB — PACEMAKER DEVICE OBSERVATION
AL AMPLITUDE: 0.2 mv
AL THRESHOLD: 0.5 V
DEVICE MODEL PM: 126125
RV LEAD IMPEDENCE PM: 708 Ohm
VENTRICULAR PACING PM: 100

## 2013-04-26 NOTE — Progress Notes (Signed)
Device check in clinic, all functions normal, no changes made, full details in PaceArt.  ROV w/ Dr. Taylor in 6mo.   

## 2013-05-09 ENCOUNTER — Encounter: Payer: Self-pay | Admitting: Internal Medicine

## 2013-09-09 ENCOUNTER — Emergency Department (HOSPITAL_COMMUNITY)
Admission: EM | Admit: 2013-09-09 | Discharge: 2013-09-09 | Disposition: A | Discharge status: Home / Self Care | Payer: Medicare Other | Source: Home / Self Care | Attending: Family Medicine | Admitting: Family Medicine

## 2013-09-09 ENCOUNTER — Encounter (HOSPITAL_COMMUNITY): Payer: Self-pay | Admitting: Emergency Medicine

## 2013-09-09 DIAGNOSIS — H60399 Other infective otitis externa, unspecified ear: Secondary | ICD-10-CM

## 2013-09-09 DIAGNOSIS — R590 Localized enlarged lymph nodes: Secondary | ICD-10-CM

## 2013-09-09 DIAGNOSIS — H6091 Unspecified otitis externa, right ear: Secondary | ICD-10-CM

## 2013-09-09 DIAGNOSIS — H6120 Impacted cerumen, unspecified ear: Secondary | ICD-10-CM

## 2013-09-09 DIAGNOSIS — H6121 Impacted cerumen, right ear: Secondary | ICD-10-CM

## 2013-09-09 DIAGNOSIS — R599 Enlarged lymph nodes, unspecified: Secondary | ICD-10-CM

## 2013-09-09 MED ORDER — AMOXICILLIN 500 MG PO CAPS: 500.0000 mg | ORAL_CAPSULE | Freq: Three times a day (TID) | ORAL | Status: DC

## 2013-09-09 MED ORDER — CIPROFLOXACIN-DEXAMETHASONE 0.3-0.1 % OT SUSP: 4.0000 [drp] | Freq: Two times a day (BID) | OTIC | Status: DC

## 2013-09-09 NOTE — Discharge Instructions (Signed)
Take medications as directed and be sure to complete. If pain or swollen lymph nodes persist after treatment arrange follow up with Dr Azucena Cecil.  Ear Drops, Adult You need to put eardrops in your ear. HOME CARE   Put drops in your affected ear as told.  After putting in the drops, lay down with the ear you put the drops in facing up. Do this for 10 minutes. Use the ear drops as long as your doctor tells you.  Before you get up, put a cotton ball gently in your ear. Do not push it far in your ear.  Do not wash out your ears unless your doctor says it is okay.  Finish all medicines as told by your doctor. You may be told to keep using the eardrops even if you start to feel better.  See your doctor as told for follow-up visits. GET HELP IF:  You have pain that gets worse.  Any unusual fluid (drainage) is coming from your ear (especially if the fluid stinks).  You have trouble hearing.  You get really dizzy as if the room is spinning and feel sick to your stomach (vertigo).  The outside of your ear becomes red or puffy or both. This may be a sign of an allergic reaction. MAKE SURE YOU:   Understand these instructions.  Will watch your condition.  Will get help right away if you are not doing well or get worse. Document Released: 12/03/2009 Document Revised: 02/15/2013 Document Reviewed: 01/10/2013 [facility] Patient Information 2014 [place], [place].  Otitis Externa Otitis externa is a germ infection in the outer ear. The outer ear is the area from the eardrum to the outside of the ear. Otitis externa is sometimes called "swimmer's ear." HOME CARE  Put drops in the ear as told by your doctor.  Only take medicine as told by your doctor.  If you have diabetes, your doctor may give you more directions. Follow your doctor's directions.  Keep all doctor visits as told. To avoid another infection:  Keep your ear dry. Use the corner of a towel to dry your ear after swimming or  bathing.  Avoid scratching or putting things inside your ear.  Avoid swimming in lakes, dirty water, or pools that use a chemical called chlorine poorly.  You may use ear drops after swimming. Combine equal amounts of white vinegar and alcohol in a bottle. Put 3 or 4 drops in each ear. GET HELP RIGHT AWAY IF:   You have a fever.  Your ear is still red, puffy (swollen), or painful after 3 days.  You still have yellowish-white fluid (pus) coming from the ear after 3 days.  Your redness, puffiness, or pain gets worse.  You have a really bad headache.  You have redness, puffiness, pain, or tenderness behind your ear. MAKE SURE YOU:   Understand these instructions.  Will watch your condition.  Will get help right away if you are not doing well or get worse. Document Released: 12/02/2007 Document Revised: 09/07/2011 Document Reviewed: 07/02/2011 [facility] Patient Information 2014 [place], [place].  Lymphadenopathy Lymphadenopathy means "disease of the lymph glands." But the term is usually used to describe swollen or enlarged lymph glands, also called lymph nodes. These are the bean-shaped organs found in many locations including the neck, underarm, and groin. Lymph glands are part of the immune system, which fights infections in your body. Lymphadenopathy can occur in just one area of the body, such as the neck, or it can be generalized,  with lymph node enlargement in several areas. The nodes found in the neck are the most common sites of lymphadenopathy. CAUSES  When your immune system responds to germs (such as viruses or bacteria ), infection-fighting cells and fluid build up. This causes the glands to grow in size. This is usually not something to worry about. Sometimes, the glands themselves can become infected and inflamed. This is called lymphadenitis. Enlarged lymph nodes can be caused by many diseases:  Bacterial disease, such as strep throat or a skin infection.  Viral  disease, such as a common cold.  Other germs, such as lyme disease, tuberculosis, or sexually transmitted diseases.  Cancers, such as lymphoma (cancer of the lymphatic system) or leukemia (cancer of the white blood cells).  Inflammatory diseases such as lupus or rheumatoid arthritis.  Reactions to medications. Many of the diseases above are rare, but important. This is why you should see your caregiver if you have lymphadenopathy. SYMPTOMS   Swollen, enlarged lumps in the neck, back of the head or other locations.  Tenderness.  Warmth or redness of the skin over the lymph nodes.  Fever. DIAGNOSIS  Enlarged lymph nodes are often near the source of infection. They can help healthcare providers diagnose your illness. For instance:   Swollen lymph nodes around the jaw might be caused by an infection in the mouth.  Enlarged glands in the neck often signal a throat infection.  Lymph nodes that are swollen in more than one area often indicate an illness caused by a virus. Your caregiver most likely will know what is causing your lymphadenopathy after listening to your history and examining you. Blood tests, x-rays or other tests may be needed. If the cause of the enlarged lymph node cannot be found, and it does not go away by itself, then a biopsy may be needed. Your caregiver will discuss this with you. TREATMENT  Treatment for your enlarged lymph nodes will depend on the cause. Many times the nodes will shrink to normal size by themselves, with no treatment. Antibiotics or other medicines may be needed for infection. Only take over-the-counter or prescription medicines for pain, discomfort or fever as directed by your caregiver. HOME CARE INSTRUCTIONS  Swollen lymph glands usually return to normal when the underlying medical condition goes away. If they persist, contact your health-care provider. He/she might prescribe antibiotics or other treatments, depending on the diagnosis. Take any  medications exactly as prescribed. Keep any follow-up appointments made to check on the condition of your enlarged nodes.  SEEK MEDICAL CARE IF:   Swelling lasts for more than two weeks.  You have symptoms such as weight loss, night sweats, fatigue or fever that does not go away.  The lymph nodes are hard, seem fixed to the skin or are growing rapidly.  Skin over the lymph nodes is red and inflamed. This could mean there is an infection. SEEK IMMEDIATE MEDICAL CARE IF:   Fluid starts leaking from the area of the enlarged lymph node.  You develop a fever of 102 F (38.9 C) or greater.  Severe pain develops (not necessarily at the site of a large lymph node).  You develop chest pain or shortness of breath.  You develop worsening abdominal pain. MAKE SURE YOU:   Understand these instructions.  Will watch your condition.  Will get help right away if you are not doing well or get worse. Document Released: 03/24/2008 Document Revised: 09/07/2011 Document Reviewed: 03/24/2008 [facility] Patient Information 2014 [place]

## 2013-09-09 NOTE — ED Notes (Signed)
C/o right side ear pain States she does have head pain radiating downward to neck States she did take an aspirin which helped a little  Does have cardiac problems States balance is off especially when she gets up States she has had a headache for a few days now

## 2013-09-11 NOTE — ED Provider Notes (Signed)
CSN: [id number]     Arrival date & time 09/09/13  1313 History   First MD Initiated Contact with Patient 09/09/13 1412     Chief Complaint  Patient presents with  . Otalgia   Patient is a 80 y.o. female presenting with ear pain. The history is provided by the patient.  Otalgia Location:  Right Behind ear:  No abnormality Quality:  Aching, dull and sore Severity:  Moderate Onset quality:  Gradual Duration:  1 week Timing:  Constant Progression:  Worsening Chronicity:  New Context: not direct blow, not elevation change, not foreign body in ear, not loud noise and no water in ear   Relieved by:  OTC medications Worsened by:  Palpation and position Associated symptoms: headaches, hearing loss and neck pain   Associated symptoms: no abdominal pain, no congestion, no cough, no diarrhea, no ear discharge, no fever, no rash, no rhinorrhea, no sore throat, no tinnitus and no vomiting   Risk factors: no recent travel, no chronic ear infection and no prior ear surgery   Pt reports > 1 week h/o (R) ear pain. The pain initially was mild but has worsened over the last several days. The pain radiates to her neck below her ear as well as the area behind her ear extending to lower (R) scalp area. Admits to diminished hearing in that ear but does not think this is new. She initially thought it was her sinuses which she has trouble w/ on and off but pain has persisted. Denies URI type sx's or significant sinus congestion.  Denies fever. States it is painful to lie on that side at night. She has also had intermittent generalized h/a's and on a couple of occasions she became dizzy when she attempted to rise from her bed. The dizziness resolved after a few minutes.  Past Medical History  Diagnosis Date  . Hypertension   . Hyperlipemia   . Hyperthyroidism   . Arthritis   . Depression   . Intertrochanteric fracture of right femur 09/22/11    right; S/P fall  . Non-sustained ventricular tachycardia   .  Third degree AV block   . Cerebrovascular disease     80 % left ICA  . Macular degeneration     Substantial visual impairment  . Peripheral vascular disease     Femoral bruit; decreased distal pulses   Past Surgical History  Procedure Laterality Date  . Appendectomy  1943  . Laryngeal nodule  ~ 1960's    removed   History reviewed. No pertinent family history. History  Substance Use Topics  . Smoking status: Former Smoker -- 1.00 packs/day for 66 years    Types: Cigarettes    Quit date: 06/21/2011  . Smokeless tobacco: Never Used  . Alcohol Use: No   OB History   Grav Para Term Preterm Abortions TAB SAB Ect Mult Living                 Review of Systems  Constitutional: Negative for fever and chills.  HENT: Positive for ear pain, hearing loss and mouth sores. Negative for congestion, ear discharge, postnasal drip, rhinorrhea, sinus pressure, sneezing, sore throat, tinnitus, trouble swallowing and voice change.   Eyes: Negative.   Respiratory: Negative.  Negative for cough.   Cardiovascular: Negative.   Gastrointestinal: Negative.  Negative for vomiting, abdominal pain and diarrhea.  Endocrine: Negative.   Genitourinary: Negative.   Musculoskeletal: Positive for neck pain.  Skin: Negative.  Negative for rash.  Allergic/Immunologic: Negative.  Neurological: Positive for headaches.  Hematological: Negative.   Psychiatric/Behavioral: Negative.     Allergies  Review of patient's allergies indicates no known allergies.  Home Medications   Current Outpatient Rx  Name  Route  Sig  Dispense  Refill  . amLODipine (NORVASC) 5 MG tablet   Oral   Take 5 mg by mouth daily.         Marland Kitchen. aspirin EC 325 MG tablet   Oral   Take 325 mg by mouth daily.         . Calcium Carb-Cholecalciferol (CALCIUM 1000 + D PO)   Oral   Take 2 tablets by mouth daily.         . cholecalciferol (VITAMIN D) 1000 UNITS tablet   Oral   Take 1,000 Units by mouth daily.         . fish  oil-omega-3 fatty acids 1000 MG capsule   Oral   Take 1 g by mouth daily.         . furosemide (LASIX) 40 MG tablet   Oral   Take 40 mg by mouth daily.         . hydrALAZINE (APRESOLINE) 25 MG tablet   Oral   Take 1 tablet (25 mg total) by mouth every 8 (eight) hours.   90 tablet   6   . lisinopril (PRINIVIL,ZESTRIL) 20 MG tablet   Oral   Take 40 mg by mouth daily.         . methimazole (TAPAZOLE) 5 MG tablet   Oral   Take 5 mg by mouth daily.         . Multiple Vitamins-Minerals (OCUVITE PRESERVISION PO)   Oral   Take 1 tablet by mouth daily.         Marland Kitchen. pyridOXINE (VITAMIN B-6) 100 MG tablet   Oral   Take 100 mg by mouth daily.         . sertraline (ZOLOFT) 100 MG tablet   Oral   Take 100 mg by mouth daily.         . simvastatin (ZOCOR) 20 MG tablet   Oral   Take 20 mg by mouth every evening.         Marland Kitchen. amoxicillin (AMOXIL) 500 MG capsule   Oral   Take 1 capsule (500 mg total) by mouth 3 (three) times daily.   30 capsule   0     For ten days   . ciprofloxacin-dexamethasone (CIPRODEX) otic suspension   Right Ear   Place 4 drops into the right ear 2 (two) times daily. For 10 days   7.5 mL   0    BP 147/79  Pulse 84  Temp(Src) 98.3 F (36.8 C) (Oral)  Resp 20  SpO2 96% Physical Exam  Constitutional: She is oriented to person, place, and time. She appears well-developed and well-nourished. No distress.  HENT:  Head: Normocephalic and atraumatic.    Right Ear: Tympanic membrane normal.  Left Ear: Tympanic membrane, external ear and ear canal normal.  Nose: Nose normal. Right sinus exhibits no maxillary sinus tenderness and no frontal sinus tenderness. Left sinus exhibits no maxillary sinus tenderness and no frontal sinus tenderness.  Mouth/Throat: Uvula is midline, oropharynx is clear and moist and mucous membranes are normal.  On initial exam (R) ear noted w/ cerumen impaction as well as an area that appeared to be fresh red blood along  floor of external ear canal. However, after irrigation cerumen impaction resolved but  the area to floor of external ear canal persist c/w  superficial abrasion.  ? Mild TTP when pushing on the tragus or pulling on the pinna. No obvious swelling to external ear canal. TTP to area below the (R) ear and posterior to the (R) ear to lower (R) scalp area.   Eyes: Conjunctivae are normal.  Neck: Normal range of motion and full passive range of motion without pain.  Almond sized fixed, palpable (R) anterior cervical lymph node that is not palpable on the (L). Is not TTP  Cardiovascular: Normal rate and regular rhythm.   Pulmonary/Chest: Effort normal and breath sounds normal.  Lymphadenopathy:    She has cervical adenopathy.  Neurological: She is alert and oriented to person, place, and time.  Skin: Skin is warm and dry.  Psychiatric: She has a normal mood and affect.    ED Course  Procedures (including critical care time) Labs Review Labs Reviewed - No data to display Imaging Review No results found.   MDM   1. Otitis externa of right ear   2. Lymphadenopathy of right cervical region   3. Right ear impacted cerumen    Persistent ear pain x > 1 week w/ TTP to surrounding tissue and palpable (R) anterior cervical lymph node. Cerumen impaction resolved w/ irrigation and re-exam rebealed normal appearing TM. Given sx's and clinical findings will treat w/ Ciprodex otic and PO Amoxicillin x 10 days. Pt (and grand daughter) instructed to arrange f/u if ear pain and (R) cervical lymphadenopathy persist after treatment.  Pt cautioned against use of Q-Tips and other small objects to "clean ears". Suggest home irrigation instead. Both verbalize understanding and are in agreement w/ plan.  Leanne Chang, NP 09/11/13 2250

## 2013-09-11 NOTE — ED Provider Notes (Signed)
Medical screening examination/treatment/procedure(s) were performed by non-physician practitioner and as supervising physician I was immediately available for consultation/collaboration.  Leslee Homeavid Violetta Lavalle, M.D.  Reuben Likesavid C Aylee Littrell, MD 09/11/13 [phone]

## 2013-11-13 ENCOUNTER — Ambulatory Visit (INDEPENDENT_AMBULATORY_CARE_PROVIDER_SITE_OTHER): Payer: Medicare Other | Admitting: Internal Medicine

## 2013-11-13 ENCOUNTER — Encounter: Payer: Self-pay | Admitting: Internal Medicine

## 2013-11-13 VITALS — BP 116/58 | HR 74 | Ht 59.0 in | Wt 106.5 lb

## 2013-11-13 DIAGNOSIS — I4729 Other ventricular tachycardia: Secondary | ICD-10-CM

## 2013-11-13 DIAGNOSIS — Z950 Presence of cardiac pacemaker: Secondary | ICD-10-CM

## 2013-11-13 DIAGNOSIS — I472 Ventricular tachycardia: Secondary | ICD-10-CM

## 2013-11-13 DIAGNOSIS — I10 Essential (primary) hypertension: Secondary | ICD-10-CM

## 2013-11-13 DIAGNOSIS — I459 Conduction disorder, unspecified: Secondary | ICD-10-CM

## 2013-11-13 DIAGNOSIS — I442 Atrioventricular block, complete: Secondary | ICD-10-CM

## 2013-11-13 NOTE — Assessment & Plan Note (Signed)
Her [place] DDD PM is working normally. Will recheck in several months. 

## 2013-11-13 NOTE — Patient Instructions (Signed)
Your physician recommends that you schedule a follow-up appointment in: 6 months with the device clinic and in 12 months with Dr.Taylor

## 2013-11-13 NOTE — Assessment & Plan Note (Signed)
Her blood pressure is well compensated. She will continue her current meds.

## 2013-11-13 NOTE — Progress Notes (Signed)
HPI [name] returns today for followup. She is a very pleasant 80 year old woman with symptomatic bradycardia, hypertension, status post permanent pacemaker insertion. She denies chest pain or shortness of breath. No syncope.  She denies syncope. No peripheral edema. No cough or hemoptysis. She has had trouble with her memory in the past year.  No Known Allergies   Current Outpatient Prescriptions  Medication Sig Dispense Refill  . amLODipine (NORVASC) 5 MG tablet Take 5 mg by mouth daily.      Marland Kitchen. aspirin EC 325 MG tablet Take 325 mg by mouth daily.      . Calcium Carb-Cholecalciferol (CALCIUM 1000 + D PO) Take 1 tablet by mouth daily.       . cholecalciferol (VITAMIN D) 1000 UNITS tablet Take 1,000 Units by mouth daily.      . fish oil-omega-3 fatty acids 1000 MG capsule Take 1 g by mouth daily.      . furosemide (LASIX) 40 MG tablet Take 40 mg by mouth daily.      Marland Kitchen. lisinopril (PRINIVIL,ZESTRIL) 20 MG tablet Take 40 mg by mouth daily.      . methimazole (TAPAZOLE) 5 MG tablet Take 5 mg by mouth daily.      . Multiple Vitamins-Minerals (OCUVITE PRESERVISION PO) Take 1 tablet by mouth daily.      . sertraline (ZOLOFT) 100 MG tablet Take 100 mg by mouth daily.      . simvastatin (ZOCOR) 20 MG tablet Take 20 mg by mouth every evening.      . vitamin B-12 (CYANOCOBALAMIN) 1000 MCG tablet Take 1,000 mcg by mouth daily.      . hydrALAZINE (APRESOLINE) 25 MG tablet Take 1 tablet (25 mg total) by mouth every 8 (eight) hours.  90 tablet  6   No current facility-administered medications for this visit.     Past Medical History  Diagnosis Date  . Hypertension   . Hyperlipemia   . Hyperthyroidism   . Arthritis   . Depression   . Intertrochanteric fracture of right femur 09/22/11    right; S/P fall  . Non-sustained ventricular tachycardia   . Third degree AV block   . Cerebrovascular disease     80 % left ICA  . Macular degeneration     Substantial visual impairment  . Peripheral  vascular disease     Femoral bruit; decreased distal pulses    ROS:   All systems reviewed and negative except as noted in the HPI.   Past Surgical History  Procedure Laterality Date  . Appendectomy  1943  . Laryngeal nodule  ~ 1960's    removed     No family history on file.   History   Social History  . Marital Status: Single    Spouse Name: N/A    Number of Children: N/A  . Years of Education: N/A   Occupational History  . Not on file.   Social History Main Topics  . Smoking status: Former Smoker -- 1.00 packs/day for 66 years    Types: Cigarettes    Quit date: 06/21/2011  . Smokeless tobacco: Never Used  . Alcohol Use: No  . Drug Use: No  . Sexual Activity: No   Other Topics Concern  . Not on file   Social History Narrative  . No narrative on file     BP 116/58  Pulse 74  Ht 4\' 11"  (1.499 m)  Wt 106 lb 8 oz (48.308 kg)  BMI 21.50 kg/m2  Physical  Exam:  Frail appearing elderly woman, NAD HEENT: Unremarkable Neck:  No JVD, no thyromegally Lungs:  Clear with no wheezes, rales, or rhonchi. HEART:  Regular rate rhythm, no murmurs, no rubs, no clicks Abd:  soft, positive bowel sounds, no organomegally, no rebound, no guarding Ext:  2 plus pulses, no edema, no cyanosis, no clubbing Skin:  No rashes no nodules Neuro:  CN II through XII intact, motor grossly intact  DEVICE  Normal device function.  See PaceArt for details.   Assess/Plan:

## 2013-11-14 ENCOUNTER — Encounter: Payer: Self-pay | Admitting: Internal Medicine

## 2013-11-14 LAB — MDC_IDC_ENUM_SESS_TYPE_INCLINIC
Date Time Interrogation Session: 20150518040000
Implantable Pulse Generator Serial Number: 126125
Lead Channel Impedance Value: 570 Ohm
Lead Channel Impedance Value: 720 Ohm
Lead Channel Pacing Threshold Amplitude: 0.4 V
Lead Channel Pacing Threshold Pulse Width: 0.4 ms
Lead Channel Pacing Threshold Pulse Width: 0.4 ms
Lead Channel Sensing Intrinsic Amplitude: 22.7 mV
Lead Channel Setting Pacing Amplitude: 1.2 V
Lead Channel Setting Pacing Amplitude: 2 V
Lead Channel Setting Pacing Pulse Width: 0.4 ms
Lead Channel Setting Sensing Sensitivity: 2.5 mV
MDC IDC MSMT LEADCHNL RA PACING THRESHOLD AMPLITUDE: 0.6 V
MDC IDC MSMT LEADCHNL RA SENSING INTR AMPL: 1.6 mV
MDC IDC SET ZONE DETECTION INTERVAL: 375 ms
MDC IDC STAT BRADY RA PERCENT PACED: 4 %
MDC IDC STAT BRADY RV PERCENT PACED: 100 %

## 2014-05-23 ENCOUNTER — Encounter: Payer: Self-pay | Admitting: *Deleted

## 2014-06-07 ENCOUNTER — Encounter (HOSPITAL_COMMUNITY): Payer: Self-pay | Admitting: Internal Medicine

## 2014-06-18 ENCOUNTER — Ambulatory Visit: Payer: Self-pay | Admitting: Podiatry

## 2014-07-12 ENCOUNTER — Ambulatory Visit (INDEPENDENT_AMBULATORY_CARE_PROVIDER_SITE_OTHER): Payer: Medicare Other | Admitting: *Deleted

## 2014-07-12 DIAGNOSIS — I459 Conduction disorder, unspecified: Secondary | ICD-10-CM

## 2014-07-12 LAB — MDC_IDC_ENUM_SESS_TYPE_INCLINIC
Battery Remaining Longevity: 84 mo
Lead Channel Pacing Threshold Amplitude: 0.4 V
Lead Channel Pacing Threshold Amplitude: 0.5 V
Lead Channel Pacing Threshold Pulse Width: 0.4 ms
Lead Channel Sensing Intrinsic Amplitude: 21 mV
Lead Channel Setting Pacing Amplitude: 1.1 V
MDC IDC MSMT LEADCHNL RA IMPEDANCE VALUE: 566 Ohm
MDC IDC MSMT LEADCHNL RA SENSING INTR AMPL: 2 mV
MDC IDC MSMT LEADCHNL RV IMPEDANCE VALUE: 677 Ohm
MDC IDC MSMT LEADCHNL RV PACING THRESHOLD PULSEWIDTH: 0.4 ms
MDC IDC PG SERIAL: 126125
MDC IDC SESS DTM: 20160114050000
MDC IDC SET LEADCHNL RA PACING AMPLITUDE: 2 V
MDC IDC SET LEADCHNL RV PACING PULSEWIDTH: 0.4 ms
MDC IDC SET LEADCHNL RV SENSING SENSITIVITY: 2.5 mV
MDC IDC STAT BRADY RA PERCENT PACED: 4 %
MDC IDC STAT BRADY RV PERCENT PACED: 100 %
Zone Setting Detection Interval: 375 ms

## 2014-07-12 NOTE — Progress Notes (Signed)
Pacemaker check in clinic. Normal device function. Thresholds, sensing, impedances consistent with previous measurements. Device programmed to maximize longevity. 23 mode switches, 22 of which were < 1 minute, - anticoagulation.  Total burden<1%.  No high ventricular rates noted.  PMT noted, V-A conduction 2 200msec, PVARP programmed 250msec.  1 VT 13 beats.   Device programmed at appropriate safety margins. Histogram distribution appropriate for patient activity level. Device programmed to optimize intrinsic conduction. Estimated longevity 7 years.  Patient education completed.  ROV 6 months with Dr. Ladona Ridgelaylor.

## 2014-08-07 ENCOUNTER — Encounter: Payer: Self-pay | Admitting: Internal Medicine

## 2014-09-19 ENCOUNTER — Ambulatory Visit: Payer: Medicare Other | Admitting: Podiatry

## 2014-09-27 ENCOUNTER — Ambulatory Visit (INDEPENDENT_AMBULATORY_CARE_PROVIDER_SITE_OTHER): Payer: Medicare Other | Admitting: Family Medicine

## 2014-09-27 ENCOUNTER — Encounter: Payer: Self-pay | Admitting: Family Medicine

## 2014-09-27 VITALS — BP 130/54 | HR 81 | Temp 97.9°F | Resp 16 | Ht 58.75 in | Wt 105.0 lb

## 2014-09-27 DIAGNOSIS — I4891 Unspecified atrial fibrillation: Secondary | ICD-10-CM

## 2014-09-27 DIAGNOSIS — R197 Diarrhea, unspecified: Secondary | ICD-10-CM | POA: Diagnosis not present

## 2014-09-27 LAB — COMPLETE METABOLIC PANEL WITH GFR
ALT: 8 U/L (ref 0–35)
AST: 23 U/L (ref 0–37)
Albumin: 4.1 g/dL (ref 3.5–5.2)
Alkaline Phosphatase: 58 U/L (ref 39–117)
BUN: 19 mg/dL (ref 6–23)
CO2: 24 mEq/L (ref 19–32)
Calcium: 9.6 mg/dL (ref 8.4–10.5)
Chloride: 99 mEq/L (ref 96–112)
Creat: 0.77 mg/dL (ref 0.50–1.10)
GFR, Est African American: 79 mL/min
GFR, Est Non African American: 69 mL/min
Glucose, Bld: 86 mg/dL (ref 70–99)
Potassium: 3.5 mEq/L (ref 3.5–5.3)
Sodium: 136 mEq/L (ref 135–145)
Total Bilirubin: 0.5 mg/dL (ref 0.2–1.2)
Total Protein: 6.4 g/dL (ref 6.0–8.3)

## 2014-09-27 LAB — POCT CBC
Granulocyte percent: 73 %G (ref 37–80)
HCT, POC: 37.8 % (ref 37.7–47.9)
Hemoglobin: 12 g/dL — AB (ref 12.2–16.2)
Lymph, poc: 1.5 (ref 0.6–3.4)
MCH, POC: 27.2 pg (ref 27–31.2)
MCHC: 31.7 g/dL — AB (ref 31.8–35.4)
MCV: 85.7 fL (ref 80–97)
MID (cbc): 0.5 (ref 0–0.9)
MPV: 7.9 fL (ref 0–99.8)
POC Granulocyte: 5.5 (ref 2–6.9)
POC LYMPH PERCENT: 20.1 %L (ref 10–50)
POC MID %: 6.9 %M (ref 0–12)
Platelet Count, POC: 245 10*3/uL (ref 142–424)
RBC: 4.41 M/uL (ref 4.04–5.48)
RDW, POC: 16.8 %
WBC: 7.5 10*3/uL (ref 4.6–10.2)

## 2014-09-27 LAB — TSH: TSH: 2.747 u[IU]/mL (ref 0.350–4.500)

## 2014-09-27 LAB — T4, FREE: Free T4: 1.12 ng/dL (ref 0.80–1.80)

## 2014-09-27 NOTE — Progress Notes (Signed)
This is an 81 year old woman with a history of atrial fibrillation, history of hyperthyroidism, and macular degeneration. She brought in by a friend today because she's had 2 weeks of diarrhea. She notes some cramping just before she goes each time. She's had no nausea or vomiting. Other than the cramping, she has no abdominal pain.  Patient has tried Imodium without any benefit.  Objective: No acute distress, alert and cooperative. She is brought in by a neighbor. HEENT: Patient is clearly blind and slightly hard of hearing. Neck: Supple no adenopathy or thyromegaly Chest: Clear Heart: Irregularly irregular with a soft 2/6 blowing murmur at the right sternal border. Abdomen: Soft, nontender with a significant bruit in the midline, no mass or guarding or rebound Extremities: Trace edema both legs   Assessment:  Unusual presentation of acute diarrhea.  This may represent an infectious process, or hyperthyroid, or some unspecified colitis.  Plan:  See patient information This chart was scribed in my presence and reviewed by me personally.    ICD-9-CM ICD-10-CM   1. Diarrhea 787.91 R19.7 POCT CBC     COMPLETE METABOLIC PANEL WITH GFR     Testosterone, Free, Total, SHBG     T4, free     TSH     Stool culture  2. Atrial fibrillation, unspecified 427.31 I48.91 POCT CBC     COMPLETE METABOLIC PANEL WITH GFR     Testosterone, Free, Total, SHBG     T4, free     TSH     Stool culture   I spent over 35 minutes face to face to discuss approach to diarrhea:  Diet, metamucil, probiotic  Signed, Elvina SidleKurt Georges Victorio, MD

## 2014-09-27 NOTE — Patient Instructions (Addendum)
Probiotic (Align) should help.  Take this daily Metamucil actually can help by absorbing some of the chemicals stimulating the bowels, and it adds bulk and form to stools Continue the imodium

## 2014-09-28 ENCOUNTER — Telehealth: Payer: Self-pay

## 2014-09-28 NOTE — Telephone Encounter (Signed)
Lab not back yet

## 2014-09-28 NOTE — Telephone Encounter (Signed)
Patients sister Kathryn [name] calling for patient to find out what type of bug was in her stool. Please call sister Kathryn [name] at [phone] or patient at [phone]

## 2014-10-01 LAB — STOOL CULTURE

## 2014-10-04 ENCOUNTER — Telehealth: Payer: Self-pay | Admitting: Internal Medicine

## 2014-10-04 NOTE — Telephone Encounter (Signed)
New problem   Pt's granddaughter calling:   Pt is having diarrhea, back aches.   Pt c/o swelling: STAT is pt has developed SOB within 24 hours  1. How long have you been experiencing swelling? 2 days  2. Where is the swelling located? Both feet  3.  Are you currently taking a "fluid pill"?No  4.  Are you currently SOB? No  5.  Have you traveled recently?No  All questions was answered by pt's granddaughter that wasn't with the pt at this present time. She is on her way to see pt within the next 2 hrs. Please call granddaughter.

## 2014-10-04 NOTE — Telephone Encounter (Signed)
New problem ° ° °Pt's granddaughter calling: ° ° °Pt is having diarrhea, back aches.  ° °Pt c/o swelling: STAT is pt has developed SOB within 24 hours ° °1. How long have you been experiencing swelling? 2 days ° °2. Where is the swelling located? Both feet ° °3.  Are you currently taking a "fluid pill"?No ° °4.  Are you currently SOB? No ° °5.  Have you traveled recently?No ° °All questions was answered by pt's granddaughter that wasn't with the pt at this present time. She is on her way to see pt within the next 2 hrs. Please call granddaughter. ° ° ° °

## 2014-10-04 NOTE — Telephone Encounter (Signed)
Spoke with granddaughter who has been out of town and she went to see her today and she is SOB and has significant swelling in her feet.  She has not taken Furosemide in years but it is still listed on her medication list  I will have her see Norma FredricksonLori Gerhardt, NP tomorrow to assess

## 2014-10-05 ENCOUNTER — Encounter: Payer: Self-pay | Admitting: Nurse Practitioner

## 2014-10-05 ENCOUNTER — Ambulatory Visit (INDEPENDENT_AMBULATORY_CARE_PROVIDER_SITE_OTHER): Payer: Medicare Other | Admitting: Nurse Practitioner

## 2014-10-05 VITALS — BP 132/62 | HR 74 | Ht 59.0 in | Wt 111.0 lb

## 2014-10-05 DIAGNOSIS — I480 Paroxysmal atrial fibrillation: Secondary | ICD-10-CM

## 2014-10-05 DIAGNOSIS — R197 Diarrhea, unspecified: Secondary | ICD-10-CM

## 2014-10-05 MED ORDER — FUROSEMIDE 20 MG PO TABS: 20.0000 mg | ORAL_TABLET | Freq: Every day | ORAL | Status: DC

## 2014-10-05 MED ORDER — POTASSIUM CHLORIDE ER 10 MEQ PO TBCR: 10.0000 meq | EXTENDED_RELEASE_TABLET | Freq: Every day | ORAL | Status: DC

## 2014-10-05 NOTE — Patient Instructions (Addendum)
Stay on your current medicines   I am going to put you back on some Lasix 20 mg to take every day for the next 3 days and then just as needed   I am going to put you on Potassium 10 meq to take every day for the next 3 days and then just on the days that   you use the Lasix  We will refer you to GI for diarrhea  See Dr. Ladona Ridgelaylor in May - this is for her recall visit   [facility] office at [phone] if you have any questions, problems or concerns.

## 2014-10-05 NOTE — Progress Notes (Signed)
CARDIOLOGY OFFICE NOTE  Date:  10/05/2014    Kathryn [name] Date of Birth: [birth year] Medical Record #[id number]  PCP:  Kathryn Hoff, MD  Cardiologist:  Kathryn [name]    Chief Complaint  Patient presents with  . Edema    Work in visit - seen for Dr. Ladona [name]     History of Present Illness: Kathryn [name] is a 81 y.o. female who presents today for a work in visit. Seen for Dr. Ladona [name]. She has a history of symptomatic bradycardia, thyroid disorder, hypertension, status post permanent pacemaker insertion.  Last seen here in May of 2015.   At PCP's a week ago after a 2 week history of diarrhea. Looks like treated with probiotics - symptomatic treatment.   Phone call yesterday - "Spoke with granddaughter who has been out of town and she went to see her today and she is SOB and has significant swelling in her feet. She has not taken Furosemide in years but it is still listed on her medication list I will have her see Kathryn Fredrickson, NP tomorrow to assess"  Thus added to the FLEX.  Comes in today. Here with her granddaughter. Her granddaughter had been out of town in [place]. Kathryn [name] has had over 3 weeks of diarrhea - says it is explosive in the am and then watery. Eating more crackers. Gets meals on wheels. Kathryn [name] was concerned about possible CHF. Kathryn [name] is NOT short of breath. No chest pain. No cough. She is sitting with her legs down more. Never had colonoscopy. Her labs from a week ago were basically normal.  She is no longer on Lasix - has not taken in several years.    Past Medical History  Diagnosis Date  . Hypertension   . Hyperlipemia   . Hyperthyroidism   . Arthritis   . Depression   . Intertrochanteric fracture of right femur 09/22/11    right; S/P fall  . Non-sustained ventricular tachycardia   . Third degree AV block   . Cerebrovascular disease     80 % left ICA  . Macular degeneration     Substantial visual impairment  . Peripheral vascular disease    Femoral bruit; decreased distal pulses    Past Surgical History  Procedure Laterality Date  . Appendectomy  1943  . Laryngeal nodule  ~ 1960's    removed  . Permanent pacemaker insertion Left 09/24/2011    Procedure: PERMANENT PACEMAKER INSERTION;  Surgeon: Marinus Maw, MD;  Location: [facility] CATH LAB;  Service: Cardiovascular;  Laterality: Left;     Medications: Current Outpatient Prescriptions  Medication Sig Dispense Refill  . amLODipine (NORVASC) 5 MG tablet Take 5 mg by mouth daily.    Marland Kitchen aspirin EC 325 MG tablet Take 325 mg by mouth daily.    . Calcium Carb-Cholecalciferol (CALCIUM 1000 + D PO) Take 1 tablet by mouth daily.     . cholecalciferol (VITAMIN D) 1000 UNITS tablet Take 1,000 Units by mouth daily.    . fish oil-omega-3 fatty acids 1000 MG capsule Take 1 g by mouth daily.    Marland Kitchen lisinopril (PRINIVIL,ZESTRIL) 20 MG tablet Take 40 mg by mouth daily.    . methimazole (TAPAZOLE) 5 MG tablet Take 5 mg by mouth daily.    . Multiple Vitamins-Minerals (OCUVITE PRESERVISION PO) Take 1 tablet by mouth daily.    . sertraline (ZOLOFT) 100 MG tablet Take 100 mg by mouth daily.    . simvastatin (ZOCOR) 20 MG tablet Take  20 mg by mouth every evening.    . vitamin B-12 (CYANOCOBALAMIN) 1000 MCG tablet Take 1,000 mcg by mouth daily.    . vitamin E 600 UNIT capsule Take 600 Units by mouth daily.    . furosemide (LASIX) 20 MG tablet Take 1 tablet (20 mg total) by mouth daily. 30 tablet 3  . hydrALAZINE (APRESOLINE) 25 MG tablet Take 1 tablet (25 mg total) by mouth every 8 (eight) hours. 90 tablet 6  . potassium chloride (K-DUR) 10 MEQ tablet Take 1 tablet (10 mEq total) by mouth daily. 30 tablet 3   No current facility-administered medications for this visit.    Allergies: No Known Allergies  Social History: The patient  reports that she quit smoking about 3 years ago. Her smoking use included Cigarettes. She has a 66 pack-year smoking history. She has never used smokeless tobacco. She  reports that she does not drink alcohol or use illicit drugs.   Family History: The patient's family history is not on file.   Review of Systems: Please see the history of present illness.   Otherwise, the review of systems is positive for fatigue, right foot swelling, diarrhea and ?facial swelling.   All other systems are reviewed and negative.   Physical Exam: VS:  BP 132/62 mmHg  Pulse 74  Ht 4\' 11"  (1.499 m)  Wt 111 lb (50.349 kg)  BMI 22.41 kg/m2  SpO2 94% .  BMI Body mass index is 22.41 kg/(m^2).  Wt Readings from Last 3 Encounters:  10/05/14 111 lb (50.349 kg)  09/27/14 105 lb (47.628 kg)  11/13/13 106 lb 8 oz (48.308 kg)    General: Pleasant. She looks frail but is in no acute distress.  HEENT: Normal.Face is a little swollen.  Neck: Supple, no JVD, carotid bruits, or masses noted.  Cardiac: Regular rate and rhythm. No murmurs, rubs, or gallops. +1 edema.  Respiratory:  Lungs are clear to auscultation bilaterally with normal work of breathing.  GI: Soft and nontender.  MS: No deformity or atrophy. Gait not tested. She is in a wheelchair.  Skin: Warm and dry. Color is normal.  Neuro:  Strength and sensation are intact and no gross focal deficits noted.  Psych: Alert, appropriate and with normal affect.   LABORATORY DATA:  EKG:  EKG is ordered today.This shows NSR, PACs, left bundle.   Lab Results  Component Value Date   WBC 7.5 09/27/2014   HGB 12.0* 09/27/2014   HCT 37.8 09/27/2014   PLT 273 10/14/2011   GLUCOSE 86 09/27/2014   ALT 8 09/27/2014   AST 23 09/27/2014   NA 136 09/27/2014   K 3.5 09/27/2014   CL 99 09/27/2014   CREATININE 0.77 09/27/2014   BUN 19 09/27/2014   CO2 24 09/27/2014   TSH 2.747 09/27/2014   INR 1.26 09/24/2011    BNP (last 3 results) No results for input(s): BNP in the last 8760 hours.  ProBNP (last 3 results) No results for input(s): PROBNP in the last 8760 hours.   Other Studies Reviewed Today:  Echo Study Conclusions  from 2013  - Left ventricle: The cavity size was normal. Wall thickness was normal. Systolic function was normal. The estimated ejection fraction was in the range of 55% to 60%. - Mitral valve: Calcified annulus. Mild to moderate regurgitation. - Left atrium: The atrium was moderately dilated. - Right atrium: The atrium was moderately dilated. - Atrial septum: No defect or patent foramen ovale was identified. - Tricuspid valve: Moderate-severe  regurgitation. - Pulmonic valve: Moderate regurgitation. - Pulmonary arteries: PA peak pressure: 67mm Hg (S).  Assessment/Plan: 1. Swelling - most likely multifactorial - has known valvular heart disease - would manage conservatively, using more salt/crackers since she is having diarrhea. Will restart lasix 20 mg with Potassium daily for 3 days and then just prn. Would hold on echo for now - if she has worsening symptoms would then consider   2. Diarrhea - needs to get to GI for evaluation  3. Valvular heart disease  4. Advanced age.  5. PAF  6. PPM in place - to see Dr. Ladona [name] next month.   Current medicines are reviewed with the patient today.  The patient does not have concerns regarding medicines other than what has been noted above.  The following changes have been made:  See above.  Labs/ tests ordered today include:    Orders Placed This Encounter  Procedures  . Ambulatory referral to Gastroenterology  . EKG 12-Lead     Disposition:   FU with Dr. Ladona [name] in May Patient is agreeable to this plan and will call if any problems develop in the interim.   Signed: Rosalio Macadamia, RN, ANP-C 10/05/2014 11:47 AM  [facility] [address] [place], [place]  [address] Phone: [phone] Fax: [phone]

## 2014-10-08 ENCOUNTER — Other Ambulatory Visit: Payer: Medicare Other

## 2014-10-08 ENCOUNTER — Ambulatory Visit (INDEPENDENT_AMBULATORY_CARE_PROVIDER_SITE_OTHER): Payer: Medicare Other | Admitting: Gastroenterology

## 2014-10-08 ENCOUNTER — Encounter: Payer: Self-pay | Admitting: Gastroenterology

## 2014-10-08 VITALS — BP 100/60 | HR 66 | Ht 59.0 in | Wt 106.6 lb

## 2014-10-08 DIAGNOSIS — R197 Diarrhea, unspecified: Secondary | ICD-10-CM | POA: Diagnosis not present

## 2014-10-08 MED ORDER — METRONIDAZOLE 250 MG PO TABS: 250.0000 mg | ORAL_TABLET | Freq: Three times a day (TID) | ORAL | Status: DC

## 2014-10-08 NOTE — Progress Notes (Signed)
HPI: This is a   very pleasant 81 year old woman  who was referred to me by Kathryn Macadamia, NP  to evaluate  Diarrhea  She is here with her granddaughter today   Diarrhea for 3-4 weeks.  Cant see so she doesn't think it's bloody.  Not daily, + waking at night for diarrhea  No abd pains  Overall stable weight.  Has been taking imodium and probiotics.  Usually 2-4 per day day.  No antibiotics.  No treatments yet except probiotics.  No colon cancer in the family.     Labs 08/2014 cbc, cmet, tsh, routine stool culture were all normal.   Review of systems: Pertinent positive and negative review of systems were noted in the above HPI section. Complete review of systems was performed and was otherwise normal.    Past Medical History  Diagnosis Date  . Hypertension   . Hyperlipemia   . Hyperthyroidism   . Arthritis   . Depression   . Intertrochanteric fracture of right femur 09/22/11    right; S/P fall  . Non-sustained ventricular tachycardia   . Third degree AV block   . Cerebrovascular disease     80 % left ICA  . Macular degeneration     Substantial visual impairment  . Peripheral vascular disease     Femoral bruit; decreased distal pulses    Past Surgical History  Procedure Laterality Date  . Appendectomy  1943  . Laryngeal nodule  ~ 1960's    removed  . Permanent pacemaker insertion Left 09/24/2011    Procedure: PERMANENT PACEMAKER INSERTION;  Surgeon: Marinus Maw, MD;  Location: [facility] CATH LAB;  Service: Cardiovascular;  Laterality: Left;    Current Outpatient Prescriptions  Medication Sig Dispense Refill  . amLODipine (NORVASC) 5 MG tablet Take 5 mg by mouth daily.    Marland Kitchen aspirin EC 325 MG tablet Take 325 mg by mouth daily.    . Calcium Carb-Cholecalciferol (CALCIUM 1000 + D PO) Take 1 tablet by mouth daily.     . cholecalciferol (VITAMIN D) 1000 UNITS tablet Take 1,000 Units by mouth daily.    . fish oil-omega-3 fatty acids 1000 MG capsule Take 1 g  by mouth daily.    . furosemide (LASIX) 20 MG tablet Take 1 tablet (20 mg total) by mouth daily. (Patient taking differently: Take 20 mg by mouth as needed. ) 30 tablet 3  . lisinopril (PRINIVIL,ZESTRIL) 20 MG tablet Take 40 mg by mouth daily.    . methimazole (TAPAZOLE) 5 MG tablet Take 5 mg by mouth daily.    . Multiple Vitamins-Minerals (OCUVITE PRESERVISION PO) Take 1 tablet by mouth daily.    . potassium chloride (K-DUR) 10 MEQ tablet Take 1 tablet (10 mEq total) by mouth daily. (Patient taking differently: Take 10 mEq by mouth as needed. Takes when she takes the lasix) 30 tablet 3  . sertraline (ZOLOFT) 100 MG tablet Take 100 mg by mouth daily.    . simvastatin (ZOCOR) 20 MG tablet Take 20 mg by mouth every evening.    . vitamin B-12 (CYANOCOBALAMIN) 1000 MCG tablet Take 1,000 mcg by mouth daily.    . vitamin E 600 UNIT capsule Take 600 Units by mouth daily.    . hydrALAZINE (APRESOLINE) 25 MG tablet Take 1 tablet (25 mg total) by mouth every 8 (eight) hours. 90 tablet 6   No current facility-administered medications for this visit.    Allergies as of 10/08/2014  . (No Known Allergies)  History reviewed. No pertinent family history.  History   Social History  . Marital Status: Single    Spouse Name: N/A  . Number of Children: N/A  . Years of Education: N/A   Occupational History  . Not on file.   Social History Main Topics  . Smoking status: Former Smoker -- 1.00 packs/day for 66 years    Types: Cigarettes    Quit date: 06/21/2011  . Smokeless tobacco: Never Used  . Alcohol Use: No  . Drug Use: No  . Sexual Activity: No   Other Topics Concern  . Not on file   Social History Narrative       Physical Exam: BP 100/60 mmHg  Pulse 66  Ht 4\' 11"  (1.499 m)  Wt 106 lb 9.6 oz (48.353 kg)  BMI 21.52 kg/m2 Constitutional: generally elderly, frail Psychiatric: alert and oriented x3 Eyes: extraocular movements intact Mouth: oral pharynx moist, no lesions Neck:  supple no lymphadenopathy Cardiovascular: heart regular rate and rhythm Lungs: clear to auscultation bilaterally Abdomen: soft, nontender, nondistended, no obvious ascites, no peritoneal signs, normal bowel sounds Extremities: no lower extremity edema bilaterally Skin: no lesions on visible extremities    Assessment and plan: 81 y.o. female with  2-3 weeks of diarrhea, nonbloody  She is frail, quite elderly. I would like to try to avoid invasive testing if at all possible. With that in mind she will get GI pathogen panel today, also Clostridium difficile by PCR testing. I'm going to start her in. Clean on Flagyl 250 mg times daily. She will also begin taking Imodium 2 pills twice daily on a scheduled routine basis. She will call the office in one week to let me she is doing.   Cc: Kathryn MacadamiaGerhardt, Lori C, NP

## 2014-10-08 NOTE — Patient Instructions (Signed)
Change so that you are taking 2 imodium every single morning after waking and another 2 imodium in early afternoon. Trial of flagyl antibiotic (one pill 3 times a day). You will have labs checked today in the basement lab.  Please head down after you check out with the front desk  (stool for gi pathogen panel, and c. Diff by PCR). Call Dr. Christella HartiganJacobs' office in 1 week to report on your symptoms.

## 2014-10-10 ENCOUNTER — Ambulatory Visit: Payer: Medicare Other | Admitting: Podiatry

## 2014-10-22 ENCOUNTER — Ambulatory Visit (INDEPENDENT_AMBULATORY_CARE_PROVIDER_SITE_OTHER): Payer: Medicare Other | Admitting: Podiatry

## 2014-10-22 ENCOUNTER — Encounter: Payer: Self-pay | Admitting: Podiatry

## 2014-10-22 VITALS — BP 106/62 | HR 65 | Resp 16 | Ht 59.0 in | Wt 106.0 lb

## 2014-10-22 DIAGNOSIS — B351 Tinea unguium: Secondary | ICD-10-CM | POA: Diagnosis not present

## 2014-10-22 DIAGNOSIS — M79676 Pain in unspecified toe(s): Secondary | ICD-10-CM

## 2014-10-22 NOTE — Progress Notes (Signed)
   Subjective:    Patient ID: Enos FlingMarie G O'Sullivan, female    DOB: 1923-01-15, 81 y.o.   MRN: [id number]  HPI Comments: N debridement L 10 toenails D and O long-term C elongated, thickened, encurvated toenails A difficult to cut T none  Pt states her right leg is shorter than the left and she would like a lift.  patient was last seen for similar problems on 12/29/2011  She has a history of a hip repair with limb shortage several years ago without any accommodations to her shoeing for this  Review of Systems  All other systems reviewed and are negative.      Objective:   Physical Exam  Patient presents with her granddaughter and appears to answer questions with assistance of her granddaughter  Vascular: DP pulses are oh/4 bilaterally PT pulses 0/4 bilaterally  Neurological: Ankle reflexes equal and reactive bilaterally Sensation to 10 g monofilament wire intact 5/5 bilaterally  Dermatological: The toenails are elongated, brittle, discolored, and tender to direct palpation 6-10 Atrophic skin without hair growth bilaterally  Musculoskeletal: Pes planus bilaterally HAV deformities bilaterally Left lower extremity appears visibly longer than right          Assessment & Plan:   Assessment: Peripheral arterial disease Symptomatic onychomycoses 6-10 Limb length inequality without any symptoms associated with this  Plan: Debridement toenails 10 without any bleeding I am not recommending any shoe modification this patient's been stable for several years without any history of gait disturbance  Reappoint at three-month intervals

## 2014-11-30 ENCOUNTER — Encounter: Payer: Self-pay | Admitting: *Deleted

## 2014-12-21 ENCOUNTER — Telehealth: Payer: Self-pay | Admitting: *Deleted

## 2014-12-21 NOTE — Telephone Encounter (Signed)
called for fm hx, no answer at either #.Marland KitchenMarland Kitchen

## 2014-12-25 ENCOUNTER — Encounter: Payer: Self-pay | Admitting: Internal Medicine

## 2014-12-25 ENCOUNTER — Ambulatory Visit (INDEPENDENT_AMBULATORY_CARE_PROVIDER_SITE_OTHER): Payer: Medicare Other | Admitting: Internal Medicine

## 2014-12-25 VITALS — BP 108/52 | HR 77 | Ht 59.0 in | Wt 106.2 lb

## 2014-12-25 DIAGNOSIS — I459 Conduction disorder, unspecified: Secondary | ICD-10-CM

## 2014-12-25 DIAGNOSIS — Z720 Tobacco use: Secondary | ICD-10-CM

## 2014-12-25 DIAGNOSIS — I4729 Other ventricular tachycardia: Secondary | ICD-10-CM

## 2014-12-25 DIAGNOSIS — I10 Essential (primary) hypertension: Secondary | ICD-10-CM

## 2014-12-25 DIAGNOSIS — Z950 Presence of cardiac pacemaker: Secondary | ICD-10-CM

## 2014-12-25 DIAGNOSIS — I472 Ventricular tachycardia: Secondary | ICD-10-CM

## 2014-12-25 NOTE — Patient Instructions (Signed)
Medication Instructions:  Your physician recommends that you continue on your current medications as directed. Please refer to the Current Medication list given to you today.   Labwork: None ordered  Testing/Procedures: None ordered  Follow-Up: Your physician wants you to follow-up in: 6 months in the device clinic and 12 months with Dr Taylor You will receive a reminder letter in the mail two months in advance. If you don't receive a letter, please call our office to schedule the follow-up appointment.   Any Other Special Instructions Will Be Listed Below (If Applicable).   

## 2014-12-25 NOTE — Assessment & Plan Note (Signed)
She stopped smoking 4 years ago. She does not appear to have much if any copd.

## 2014-12-25 NOTE — Assessment & Plan Note (Signed)
Interogation of her PPM demonstrates this is rare and still non-sustained. She is asymptomatic.

## 2014-12-25 NOTE — Assessment & Plan Note (Signed)
Her [place] DDD PM is working normally. Will recheck in several months. 

## 2014-12-25 NOTE — Assessment & Plan Note (Signed)
Her blood pressure is well controlled. No change in her medications.  

## 2014-12-25 NOTE — Progress Notes (Signed)
HPI [name] returns today for followup. She is a very pleasant 81 year old woman with symptomatic bradycardia, hypertension, status post permanent pacemaker insertion. She denies chest pain or shortness of breath. No syncope.  She denies syncope. No peripheral edema. No cough or hemoptysis. She has had trouble with her memory in the past year. She has had trouble walking and is using a wheelchair and a walker.  No Known Allergies   Current Outpatient Prescriptions  Medication Sig Dispense Refill  . amLODipine (NORVASC) 5 MG tablet Take 5 mg by mouth daily.    Marland Kitchen. aspirin EC 325 MG tablet Take 325 mg by mouth daily.    . Calcium Carb-Cholecalciferol (CALCIUM 1000 + D PO) Take 1 tablet by mouth daily.     . cholecalciferol (VITAMIN D) 1000 UNITS tablet Take 1,000 Units by mouth daily.    . fish oil-omega-3 fatty acids 1000 MG capsule Take 1 g by mouth daily.    . furosemide (LASIX) 20 MG tablet Take 20 mg by mouth daily as needed for fluid or edema.    . hydrALAZINE (APRESOLINE) 25 MG tablet Take 1 tablet (25 mg total) by mouth every 8 (eight) hours. 90 tablet 6  . lisinopril (PRINIVIL,ZESTRIL) 40 MG tablet Take 1 tablet by mouth daily.    . methimazole (TAPAZOLE) 5 MG tablet Take 5 mg by mouth daily.    . Multiple Vitamins-Minerals (OCUVITE PRESERVISION PO) Take 1 tablet by mouth daily.    . sertraline (ZOLOFT) 100 MG tablet Take 100 mg by mouth daily.    . simvastatin (ZOCOR) 20 MG tablet Take 20 mg by mouth every evening.    . vitamin B-12 (CYANOCOBALAMIN) 1000 MCG tablet Take 1,000 mcg by mouth daily.    . vitamin E 600 UNIT capsule Take 600 Units by mouth daily.     No current facility-administered medications for this visit.     Past Medical History  Diagnosis Date  . Hypertension   . Hyperlipemia   . Hyperthyroidism   . Arthritis   . Depression   . Intertrochanteric fracture of right femur 09/22/11    right; S/P fall  . Non-sustained ventricular tachycardia   . Third  degree AV block   . Cerebrovascular disease     80 % left ICA  . Macular degeneration     Substantial visual impairment  . Peripheral vascular disease     Femoral bruit; decreased distal pulses    ROS:   All systems reviewed and negative except as noted in the HPI.   Past Surgical History  Procedure Laterality Date  . Appendectomy  1943  . Laryngeal nodule  ~ 1960's    removed  . Permanent pacemaker insertion Left 09/24/2011    Procedure: PERMANENT PACEMAKER INSERTION;  Surgeon: Marinus MawGregg W Taylor, MD;  [facility] CATH LAB;  Service: Cardiovascular;  Laterality: Left;     Family History  Problem Relation Age of Onset  . Diabetes Maternal Aunt   . Diabetes Maternal Uncle   . Cancer Maternal Grandmother      History   Social History  . Marital Status: Single    Spouse Name: N/A  . Number of Children: N/A  . Years of Education: N/A   Occupational History  . Not on file.   Social History Main Topics  . Smoking status: Former Smoker -- 1.00 packs/day for 66 years    Types: Cigarettes    Quit date: 06/21/2011  . Smokeless tobacco: Never Used  . Alcohol Use:  No  . Drug Use: No  . Sexual Activity: No   Other Topics Concern  . Not on file   Social History Narrative     BP 108/52 mmHg  Pulse 77  Ht  (1.499 m)  Wt 106 lb 3.2 oz (48.172 kg)  BMI 21.44 kg/m2  Physical Exam:  Frail appearing elderly woman, NAD HEENT: Unremarkable Neck:  No JVD, no thyromegally Lungs:  Clear with no wheezes, rales, or rhonchi. HEART:  Regular rate rhythm, no murmurs, no rubs, no clicks Abd:  soft, positive bowel sounds, no organomegally, no rebound, no guarding Ext:  2 plus pulses, no edema, no cyanosis, no clubbing Skin:  No rashes no nodules Neuro:  CN II through XII intact, motor grossly intact  DEVICE  Normal device function.  See PaceArt for details.   Assess/Plan:

## 2014-12-26 LAB — CUP PACEART INCLINIC DEVICE CHECK
Lead Channel Impedance Value: 668 Ohm
Lead Channel Pacing Threshold Amplitude: 0.6 V
Lead Channel Pacing Threshold Amplitude: 0.7 V
Lead Channel Pacing Threshold Pulse Width: 0.4 ms
Lead Channel Sensing Intrinsic Amplitude: 21.5 mV
Lead Channel Setting Pacing Amplitude: 2 V
Lead Channel Setting Sensing Sensitivity: 2.5 mV
MDC IDC MSMT LEADCHNL RA IMPEDANCE VALUE: 643 Ohm
MDC IDC MSMT LEADCHNL RA SENSING INTR AMPL: 1.7 mV
MDC IDC MSMT LEADCHNL RV PACING THRESHOLD PULSEWIDTH: 0.4 ms
MDC IDC SESS DTM: 20160628040000
MDC IDC SET LEADCHNL RV PACING AMPLITUDE: 1.1 V
MDC IDC SET LEADCHNL RV PACING PULSEWIDTH: 0.4 ms
MDC IDC SET ZONE DETECTION INTERVAL: 375 ms
Pulse Gen Serial Number: 126125

## 2015-02-08 ENCOUNTER — Emergency Department (HOSPITAL_COMMUNITY): Payer: Medicare Other

## 2015-02-08 ENCOUNTER — Emergency Department (HOSPITAL_COMMUNITY)
Admission: EM | Admit: 2015-02-08 | Discharge: 2015-02-08 | Disposition: A | Discharge status: Home / Self Care | Payer: Medicare Other | Attending: Emergency Medicine | Admitting: Emergency Medicine

## 2015-02-08 ENCOUNTER — Encounter (HOSPITAL_COMMUNITY): Payer: Self-pay | Admitting: Emergency Medicine

## 2015-02-08 DIAGNOSIS — I10 Essential (primary) hypertension: Secondary | ICD-10-CM | POA: Insufficient documentation

## 2015-02-08 DIAGNOSIS — W1839XA Other fall on same level, initial encounter: Secondary | ICD-10-CM | POA: Diagnosis not present

## 2015-02-08 DIAGNOSIS — E0590 Thyrotoxicosis, unspecified without thyrotoxic crisis or storm: Secondary | ICD-10-CM | POA: Insufficient documentation

## 2015-02-08 DIAGNOSIS — S59902A Unspecified injury of left elbow, initial encounter: Secondary | ICD-10-CM | POA: Diagnosis not present

## 2015-02-08 DIAGNOSIS — E785 Hyperlipidemia, unspecified: Secondary | ICD-10-CM | POA: Insufficient documentation

## 2015-02-08 DIAGNOSIS — M1990 Unspecified osteoarthritis, unspecified site: Secondary | ICD-10-CM | POA: Insufficient documentation

## 2015-02-08 DIAGNOSIS — Y9289 Other specified places as the place of occurrence of the external cause: Secondary | ICD-10-CM | POA: Insufficient documentation

## 2015-02-08 DIAGNOSIS — Z87891 Personal history of nicotine dependence: Secondary | ICD-10-CM | POA: Diagnosis not present

## 2015-02-08 DIAGNOSIS — Z7982 Long term (current) use of aspirin: Secondary | ICD-10-CM | POA: Diagnosis not present

## 2015-02-08 DIAGNOSIS — F329 Major depressive disorder, single episode, unspecified: Secondary | ICD-10-CM | POA: Insufficient documentation

## 2015-02-08 DIAGNOSIS — Y9389 Activity, other specified: Secondary | ICD-10-CM | POA: Diagnosis not present

## 2015-02-08 DIAGNOSIS — Z79899 Other long term (current) drug therapy: Secondary | ICD-10-CM | POA: Diagnosis not present

## 2015-02-08 DIAGNOSIS — Y998 Other external cause status: Secondary | ICD-10-CM | POA: Diagnosis not present

## 2015-02-08 DIAGNOSIS — W19XXXA Unspecified fall, initial encounter: Secondary | ICD-10-CM

## 2015-02-08 NOTE — Discharge Instructions (Signed)

## 2015-02-08 NOTE — ED Provider Notes (Signed)
CSN: [id number]     Arrival date & time 02/08/15  1353 History   First MD Initiated Contact with Patient 02/08/15 1505     Chief Complaint  Patient presents with  . Fall     (Consider location/radiation/quality/duration/timing/severity/associated sxs/prior Treatment) The history is provided by the patient and a relative. No language interpreter was used.  Patient is an 81 year old female with past medical history of hypertension, third-degree AV block with pacemaker implant who presents today one day status post fall. Patient reports that yesterday she heard the doorbell ringing got up because she was startled and had a mechanical fall. Patient relates she fell and bumped her forehead and fell to her left side hitting her left elbow. Patient typically ambulates with a cane but daughter did note that patient was relying on Walker this morning to get up. Patient denies any specific pain in her legs but states she hurts all over. She does relate that she felt nauseated today and had one episode of vomiting yesterday. She denies any chest pain or shortness of breath. She denies any abdominal pain. She denies loss of consciousness with this fall though this was unwitnessed. Patient is not on blood thinners. Patient denies neck pain. Patient denies new changes in her vision. Patient does relate that she is having pain in her right shoulder. Patient is not having any numbness or tingling in any of her extremities. Daughter who is present in the room states she has not noticed any worsening confusion since the fall yesterday.  Past Medical History  Diagnosis Date  . Hypertension   . Hyperlipemia   . Hyperthyroidism   . Arthritis   . Depression   . Intertrochanteric fracture of right femur 09/22/11    right; S/P fall  . Non-sustained ventricular tachycardia   . Third degree AV block   . Cerebrovascular disease     80 % left ICA  . Macular degeneration     Substantial visual impairment  . Peripheral  vascular disease     Femoral bruit; decreased distal pulses   Past Surgical History  Procedure Laterality Date  . Appendectomy  1943  . Laryngeal nodule  ~ 1960's    removed  . Permanent pacemaker insertion Left 09/24/2011    Procedure: PERMANENT PACEMAKER INSERTION;  Surgeon: Marinus Maw, MD;  Location: [facility] CATH LAB;  Service: Cardiovascular;  Laterality: Left;   Family History  Problem Relation Age of Onset  . Diabetes Maternal Aunt   . Diabetes Maternal Uncle   . Cancer Maternal Grandmother    Social History  Substance Use Topics  . Smoking status: Former Smoker -- 1.00 packs/day for 66 years    Types: Cigarettes    Quit date: 06/21/2011  . Smokeless tobacco: Never Used  . Alcohol Use: No   OB History    No data available     Review of Systems  Constitutional: Negative for fever and chills.  Respiratory: Negative for shortness of breath and wheezing.   Cardiovascular: Negative for chest pain.  Gastrointestinal: Positive for nausea and vomiting (pt relates one episode of vomiting yesterday).  Musculoskeletal: Positive for myalgias (bilateral thighs) and arthralgias (L elbow). Negative for back pain, neck pain and neck stiffness.  Skin: Negative for pallor and wound.  Neurological: Negative for syncope, light-headedness and headaches.  Psychiatric/Behavioral: Negative for confusion (daughter denies any change today) and agitation.  All other systems reviewed and are negative.     Allergies  Review of patient's allergies indicates no  known allergies.  Home Medications   Prior to Admission medications   Medication Sig Start Date End Date Taking? Authorizing Provider  amLODipine (NORVASC) 5 MG tablet Take 5 mg by mouth daily.    Historical Provider, MD  aspirin EC 325 MG tablet Take 325 mg by mouth daily.    Historical Provider, MD  Calcium Carb-Cholecalciferol (CALCIUM 1000 + D PO) Take 1 tablet by mouth daily.     Historical Provider, MD  cholecalciferol (VITAMIN  D) 1000 UNITS tablet Take 1,000 Units by mouth daily.    Historical Provider, MD  fish oil-omega-3 fatty acids 1000 MG capsule Take 1 g by mouth daily.    Historical Provider, MD  furosemide (LASIX) 20 MG tablet Take 20 mg by mouth daily as needed for fluid or edema.    Historical Provider, MD  hydrALAZINE (APRESOLINE) 25 MG tablet Take 1 tablet (25 mg total) by mouth every 8 (eight) hours. 10/24/12 12/25/14  Brooke O Edmisten, PA-C  lisinopril (PRINIVIL,ZESTRIL) 40 MG tablet Take 1 tablet by mouth daily. 11/27/14   Historical Provider, MD  methimazole (TAPAZOLE) 5 MG tablet Take 5 mg by mouth daily.    Historical Provider, MD  Multiple Vitamins-Minerals (OCUVITE PRESERVISION PO) Take 1 tablet by mouth daily.    Historical Provider, MD  sertraline (ZOLOFT) 100 MG tablet Take 100 mg by mouth daily.    Historical Provider, MD  simvastatin (ZOCOR) 20 MG tablet Take 20 mg by mouth every evening.    Historical Provider, MD  vitamin B-12 (CYANOCOBALAMIN) 1000 MCG tablet Take 1,000 mcg by mouth daily.    Historical Provider, MD  vitamin E 600 UNIT capsule Take 600 Units by mouth daily.    Historical Provider, MD   BP 107/85 mmHg  Pulse 83  Temp(Src) 98.5 F (36.9 C) (Oral)  Resp 18  Ht 4\' 10"  (1.473 m)  Wt 107 lb (48.535 kg)  BMI 22.37 kg/m2  SpO2 90% Physical Exam  Constitutional: She is oriented to person, place, and time. No distress.  Elderly and frail appearing  HENT:  Head: Normocephalic and atraumatic.  Eyes: Conjunctivae and EOM are normal. Pupils are equal, round, and reactive to light.  Neck: Normal range of motion. Neck supple.  No midline C spine tenderness  Cardiovascular: Normal rate and regular rhythm.   Pulmonary/Chest: Effort normal and breath sounds normal.  Abdominal: Soft. She exhibits no distension. There is no tenderness. There is no rebound and no guarding.  Musculoskeletal: Normal range of motion. She exhibits tenderness (mild L elbow tenderness. Mild bilateral thigh  ttp, no obvious deformity).  Neurological: She is alert and oriented to person, place, and time.  Skin: Skin is warm and dry. She is not diaphoretic.  Psychiatric: She has a normal mood and affect. Her behavior is normal.  Nursing note and vitals reviewed.   ED Course  Procedures (including critical care time) Labs Review Labs Reviewed - No data to display  Imaging Review Dg Elbow Complete Left  02/08/2015   CLINICAL DATA:  Fall  EXAM: LEFT ELBOW - COMPLETE 3+ VIEW  COMPARISON:  None.  FINDINGS: Five views of the left elbow submitted. No acute fracture or subluxation. No posterior fat pad sign.  IMPRESSION: Negative.   Electronically Signed   By: Natasha Mead M.D.   On: 02/08/2015 15:30   Ct Head Wo Contrast  02/08/2015   CLINICAL DATA:  Larey Seat yesterday onto LEFT side, tripped when startled by door bell, struck head, no loss of consciousness, history  hypertension, hyperlipidemia, third-degree heart block, former smoker  EXAM: CT HEAD WITHOUT CONTRAST  CT CERVICAL SPINE WITHOUT CONTRAST  TECHNIQUE: Multidetector CT imaging of the head and cervical spine was performed following the standard protocol without intravenous contrast. Multiplanar CT image reconstructions of the cervical spine were also generated.  COMPARISON:  10/14/2011  FINDINGS: CT HEAD FINDINGS  Generalized atrophy.  Diffuse ex vacuo dilatation of ventricular system, unchanged.  No midline shift or mass effect.  Small vessel chronic ischemic changes of deep cerebral white matter.  Old RIGHT basal ganglia lacunar infarcts.  No intracranial hemorrhage, mass lesion or evidence acute infarction.  No extra-axial fluid collections.  Stable calcification at medial RIGHT frontal lobe.  Bones demineralized without identified fracture.  Atherosclerotic calcification of internal carotid and vertebral arteries at skullbase.  CT CERVICAL SPINE FINDINGS  Extensive atherosclerotic calcification in the carotid systems.  Multiple BILATERAL thyroid nodules  again identified grossly unchanged since 2013.  Visualized skullbase intact.  Prevertebral soft tissues normal thickness.  Multilevel disc space narrowing and endplate spur formation cervical spine.  Vertebral body heights maintained without fracture or subluxation.  Diffuse multilevel facet degenerative changes as well.  Small amount of calcified pannus surrounding odontoid process.  Lung apices clear.  IMPRESSION: Generalized atrophy with ex vacuo dilatation of the ventricular system.  Small vessel chronic ischemic changes of deep cerebral white matter.  Old RIGHT basal ganglia lacunar infarcts.  No acute intracranial abnormalities.  Osseous demineralization with multilevel degenerative disc and facet disease changes of the cervical spine.  No acute cervical spine abnormalities.   Electronically Signed   By: Ulyses Southward M.D.   On: 02/08/2015 16:13   Ct Cervical Spine Wo Contrast  02/08/2015   CLINICAL DATA:  Larey Seat yesterday onto LEFT side, tripped when startled by door bell, struck head, no loss of consciousness, history hypertension, hyperlipidemia, third-degree heart block, former smoker  EXAM: CT HEAD WITHOUT CONTRAST  CT CERVICAL SPINE WITHOUT CONTRAST  TECHNIQUE: Multidetector CT imaging of the head and cervical spine was performed following the standard protocol without intravenous contrast. Multiplanar CT image reconstructions of the cervical spine were also generated.  COMPARISON:  10/14/2011  FINDINGS: CT HEAD FINDINGS  Generalized atrophy.  Diffuse ex vacuo dilatation of ventricular system, unchanged.  No midline shift or mass effect.  Small vessel chronic ischemic changes of deep cerebral white matter.  Old RIGHT basal ganglia lacunar infarcts.  No intracranial hemorrhage, mass lesion or evidence acute infarction.  No extra-axial fluid collections.  Stable calcification at medial RIGHT frontal lobe.  Bones demineralized without identified fracture.  Atherosclerotic calcification of internal carotid  and vertebral arteries at skullbase.  CT CERVICAL SPINE FINDINGS  Extensive atherosclerotic calcification in the carotid systems.  Multiple BILATERAL thyroid nodules again identified grossly unchanged since 2013.  Visualized skullbase intact.  Prevertebral soft tissues normal thickness.  Multilevel disc space narrowing and endplate spur formation cervical spine.  Vertebral body heights maintained without fracture or subluxation.  Diffuse multilevel facet degenerative changes as well.  Small amount of calcified pannus surrounding odontoid process.  Lung apices clear.  IMPRESSION: Generalized atrophy with ex vacuo dilatation of the ventricular system.  Small vessel chronic ischemic changes of deep cerebral white matter.  Old RIGHT basal ganglia lacunar infarcts.  No acute intracranial abnormalities.  Osseous demineralization with multilevel degenerative disc and facet disease changes of the cervical spine.  No acute cervical spine abnormalities.   Electronically Signed   By: Ulyses Southward M.D.   On: 02/08/2015 16:13  I, Madolyn Frieze, personally reviewed and evaluated these images and lab results as part of my medical decision-making.   EKG Interpretation None      MDM   Final diagnoses:  Fall from standing, initial encounter  Elbow injury, left, initial encounter    Patient is an 81 year old female with past medical history of hypertension, third-degree AV block with pacemaker implant who presents today one day status post fall. No obvious deformities on exam today. The patient is neurologically intact. She does report that she hit her head given her age we did obtain a CT head and C-spine that did not show any acute intracranial abnormalities were obvious fractures of the cervical spine. Patient also complaining of left elbow pain. Patient was neurovascularly intact distal to this and obtained an x-ray which did not show any bony fractures or dislocations. Patient is awake and alert and daughter states  she is at her baseline. Given reassuring findings on exam and imaging today we'll discharge the patient home with return precautions. Patient and daughter are both agreeable with this.    Madolyn Frieze, MD 02/10/15 1610  Nelva Nay, MD 02/10/15 [phone]

## 2015-02-08 NOTE — ED Notes (Signed)
Patient states she had a mechanical fall yesterday and fell to the L side.  Patient was asleep and the doorbell rang, it startled her and she tripped.   Patient states "my everything hurts".  Unable to determine exactly where patient hurts.  Patient does have a bruise to L elbow with some swelling.  Patient states her back and her butt hurts also.   Patient states hit head but no loss of consciousness.

## 2015-02-16 ENCOUNTER — Inpatient Hospital Stay (HOSPITAL_COMMUNITY)
Admission: EM | Admit: 2015-02-16 | Discharge: 2015-02-21 | DRG: 536 | Disposition: A | Discharge status: Skilled Nursing Facility | Payer: Medicare Other | Attending: Internal Medicine | Admitting: Internal Medicine

## 2015-02-16 ENCOUNTER — Inpatient Hospital Stay (HOSPITAL_COMMUNITY): Payer: Medicare Other

## 2015-02-16 ENCOUNTER — Emergency Department (HOSPITAL_COMMUNITY): Payer: Medicare Other

## 2015-02-16 ENCOUNTER — Encounter (HOSPITAL_COMMUNITY): Payer: Self-pay | Admitting: *Deleted

## 2015-02-16 DIAGNOSIS — B961 Klebsiella pneumoniae [K. pneumoniae] as the cause of diseases classified elsewhere: Secondary | ICD-10-CM | POA: Diagnosis present

## 2015-02-16 DIAGNOSIS — R197 Diarrhea, unspecified: Secondary | ICD-10-CM | POA: Diagnosis present

## 2015-02-16 DIAGNOSIS — Z809 Family history of malignant neoplasm, unspecified: Secondary | ICD-10-CM

## 2015-02-16 DIAGNOSIS — I10 Essential (primary) hypertension: Secondary | ICD-10-CM | POA: Diagnosis not present

## 2015-02-16 DIAGNOSIS — Z79899 Other long term (current) drug therapy: Secondary | ICD-10-CM

## 2015-02-16 DIAGNOSIS — I679 Cerebrovascular disease, unspecified: Secondary | ICD-10-CM | POA: Diagnosis present

## 2015-02-16 DIAGNOSIS — M1990 Unspecified osteoarthritis, unspecified site: Secondary | ICD-10-CM | POA: Diagnosis present

## 2015-02-16 DIAGNOSIS — L8990 Pressure ulcer of unspecified site, unspecified stage: Secondary | ICD-10-CM | POA: Diagnosis present

## 2015-02-16 DIAGNOSIS — Z7982 Long term (current) use of aspirin: Secondary | ICD-10-CM

## 2015-02-16 DIAGNOSIS — E871 Hypo-osmolality and hyponatremia: Secondary | ICD-10-CM | POA: Diagnosis present

## 2015-02-16 DIAGNOSIS — R102 Pelvic and perineal pain: Secondary | ICD-10-CM | POA: Diagnosis present

## 2015-02-16 DIAGNOSIS — R262 Difficulty in walking, not elsewhere classified: Secondary | ICD-10-CM | POA: Diagnosis present

## 2015-02-16 DIAGNOSIS — W19XXXA Unspecified fall, initial encounter: Secondary | ICD-10-CM

## 2015-02-16 DIAGNOSIS — E876 Hypokalemia: Secondary | ICD-10-CM | POA: Diagnosis present

## 2015-02-16 DIAGNOSIS — H547 Unspecified visual loss: Secondary | ICD-10-CM | POA: Diagnosis present

## 2015-02-16 DIAGNOSIS — D62 Acute posthemorrhagic anemia: Secondary | ICD-10-CM | POA: Diagnosis present

## 2015-02-16 DIAGNOSIS — IMO0002 Reserved for concepts with insufficient information to code with codable children: Secondary | ICD-10-CM

## 2015-02-16 DIAGNOSIS — Z833 Family history of diabetes mellitus: Secondary | ICD-10-CM

## 2015-02-16 DIAGNOSIS — W010XXA Fall on same level from slipping, tripping and stumbling without subsequent striking against object, initial encounter: Secondary | ICD-10-CM | POA: Diagnosis present

## 2015-02-16 DIAGNOSIS — E0590 Thyrotoxicosis, unspecified without thyrotoxic crisis or storm: Secondary | ICD-10-CM | POA: Diagnosis present

## 2015-02-16 DIAGNOSIS — S32810A Multiple fractures of pelvis with stable disruption of pelvic ring, initial encounter for closed fracture: Secondary | ICD-10-CM | POA: Diagnosis not present

## 2015-02-16 DIAGNOSIS — F329 Major depressive disorder, single episode, unspecified: Secondary | ICD-10-CM | POA: Diagnosis present

## 2015-02-16 DIAGNOSIS — R918 Other nonspecific abnormal finding of lung field: Secondary | ICD-10-CM | POA: Diagnosis present

## 2015-02-16 DIAGNOSIS — R55 Syncope and collapse: Secondary | ICD-10-CM | POA: Diagnosis present

## 2015-02-16 DIAGNOSIS — F0390 Unspecified dementia without behavioral disturbance: Secondary | ICD-10-CM | POA: Diagnosis present

## 2015-02-16 DIAGNOSIS — I272 Other secondary pulmonary hypertension: Secondary | ICD-10-CM | POA: Diagnosis present

## 2015-02-16 DIAGNOSIS — E785 Hyperlipidemia, unspecified: Secondary | ICD-10-CM | POA: Diagnosis present

## 2015-02-16 DIAGNOSIS — Z9049 Acquired absence of other specified parts of digestive tract: Secondary | ICD-10-CM | POA: Diagnosis present

## 2015-02-16 DIAGNOSIS — Z66 Do not resuscitate: Secondary | ICD-10-CM | POA: Diagnosis present

## 2015-02-16 DIAGNOSIS — K5900 Constipation, unspecified: Secondary | ICD-10-CM | POA: Diagnosis present

## 2015-02-16 DIAGNOSIS — R0600 Dyspnea, unspecified: Secondary | ICD-10-CM | POA: Diagnosis not present

## 2015-02-16 DIAGNOSIS — S32810D Multiple fractures of pelvis with stable disruption of pelvic ring, subsequent encounter for fracture with routine healing: Secondary | ICD-10-CM | POA: Diagnosis not present

## 2015-02-16 DIAGNOSIS — I739 Peripheral vascular disease, unspecified: Secondary | ICD-10-CM | POA: Diagnosis present

## 2015-02-16 DIAGNOSIS — Z950 Presence of cardiac pacemaker: Secondary | ICD-10-CM

## 2015-02-16 DIAGNOSIS — Z9181 History of falling: Secondary | ICD-10-CM | POA: Diagnosis not present

## 2015-02-16 DIAGNOSIS — I442 Atrioventricular block, complete: Secondary | ICD-10-CM | POA: Diagnosis present

## 2015-02-16 DIAGNOSIS — S3282XA Multiple fractures of pelvis without disruption of pelvic ring, initial encounter for closed fracture: Secondary | ICD-10-CM | POA: Diagnosis present

## 2015-02-16 DIAGNOSIS — N390 Urinary tract infection, site not specified: Secondary | ICD-10-CM | POA: Diagnosis present

## 2015-02-16 DIAGNOSIS — H3530 Unspecified macular degeneration: Secondary | ICD-10-CM | POA: Diagnosis present

## 2015-02-16 DIAGNOSIS — E860 Dehydration: Secondary | ICD-10-CM | POA: Diagnosis present

## 2015-02-16 DIAGNOSIS — Z87891 Personal history of nicotine dependence: Secondary | ICD-10-CM | POA: Diagnosis not present

## 2015-02-16 LAB — CBC
HCT: 26.6 % — ABNORMAL LOW (ref 36.0–46.0)
HCT: 28.3 % — ABNORMAL LOW (ref 36.0–46.0)
HEMOGLOBIN: 9.1 g/dL — AB (ref 12.0–15.0)
HEMOGLOBIN: 9.2 g/dL — AB (ref 12.0–15.0)
MCH: 29.6 pg (ref 26.0–34.0)
MCH: 28.2 pg (ref 26.0–34.0)
MCHC: 34.2 g/dL (ref 30.0–36.0)
MCHC: 32.5 g/dL (ref 30.0–36.0)
MCV: 86.6 fL (ref 78.0–100.0)
MCV: 86.8 fL (ref 78.0–100.0)
PLATELETS: 379 10*3/uL (ref 150–400)
Platelets: 375 10*3/uL (ref 150–400)
RBC: 3.07 MIL/uL — AB (ref 3.87–5.11)
RBC: 3.26 MIL/uL — AB (ref 3.87–5.11)
RDW: 17.5 % — ABNORMAL HIGH (ref 11.5–15.5)
RDW: 17.5 % — ABNORMAL HIGH (ref 11.5–15.5)
WBC: 9 10*3/uL (ref 4.0–10.5)
WBC: 11.6 10*3/uL — AB (ref 4.0–10.5)

## 2015-02-16 LAB — BASIC METABOLIC PANEL
ANION GAP: 7 (ref 5–15)
BUN: 18 mg/dL (ref 6–20)
CO2: 28 mmol/L (ref 22–32)
Calcium: 9 mg/dL (ref 8.9–10.3)
Chloride: 93 mmol/L — ABNORMAL LOW (ref 101–111)
Creatinine, Ser: 0.75 mg/dL (ref 0.44–1.00)
GFR calc non Af Amer: >60 mL/min (ref >60)
GLUCOSE: 127 mg/dL — AB (ref 65–99)
POTASSIUM: 3.1 mmol/L — AB (ref 3.5–5.1)
Sodium: 128 mmol/L — ABNORMAL LOW (ref 135–145)

## 2015-02-16 LAB — TSH: TSH: 2.881 u[IU]/mL (ref 0.350–4.500)

## 2015-02-16 LAB — CREATININE, SERUM
Creatinine, Ser: 0.68 mg/dL (ref 0.44–1.00)
GFR calc non Af Amer: >60 mL/min (ref >60)

## 2015-02-16 LAB — ABO/RH: ABO/RH(D): B POS

## 2015-02-16 LAB — TROPONIN I: Troponin I: 0.03 ng/mL (ref <0.031)

## 2015-02-16 LAB — PREPARE RBC (CROSSMATCH)

## 2015-02-16 LAB — MAGNESIUM: MAGNESIUM: 1.5 mg/dL — AB (ref 1.7–2.4)

## 2015-02-16 MED ORDER — LEVALBUTEROL HCL 0.63 MG/3ML IN NEBU: 0.6300 mg | INHALATION_SOLUTION | Freq: Four times a day (QID) | RESPIRATORY_TRACT | Status: DC | PRN

## 2015-02-16 MED ORDER — ACETAMINOPHEN 650 MG RE SUPP: 650.0000 mg | Freq: Four times a day (QID) | RECTAL | Status: DC | PRN

## 2015-02-16 MED ORDER — ACETAMINOPHEN 325 MG PO TABS: 650.0000 mg | ORAL_TABLET | Freq: Four times a day (QID) | ORAL | Status: DC | PRN

## 2015-02-16 MED ORDER — SODIUM CHLORIDE 0.9 % IV SOLN
Freq: Once | INTRAVENOUS | Status: AC
  Administered 2015-02-17: 02:00:00 via INTRAVENOUS

## 2015-02-16 MED ORDER — ENOXAPARIN SODIUM 40 MG/0.4ML ~~LOC~~ SOLN
40.0000 mg | SUBCUTANEOUS | Status: DC
  Administered 2015-02-16 – 2015-02-20 (×5): 40 mg via SUBCUTANEOUS
  Filled 2015-02-16 (×6): qty 0.4

## 2015-02-16 MED ORDER — SERTRALINE HCL 100 MG PO TABS
100.0000 mg | ORAL_TABLET | Freq: Every day | ORAL | Status: DC
  Administered 2015-02-17 – 2015-02-21 (×5): 100 mg via ORAL
  Filled 2015-02-16 (×6): qty 1

## 2015-02-16 MED ORDER — MAGNESIUM SULFATE 2 GM/50ML IV SOLN
2.0000 g | Freq: Once | INTRAVENOUS | Status: AC
  Administered 2015-02-16: 2 g via INTRAVENOUS
  Filled 2015-02-16: qty 50

## 2015-02-16 MED ORDER — ONDANSETRON HCL 4 MG PO TABS: 4.0000 mg | ORAL_TABLET | Freq: Four times a day (QID) | ORAL | Status: DC | PRN

## 2015-02-16 MED ORDER — SENNOSIDES-DOCUSATE SODIUM 8.6-50 MG PO TABS
1.0000 | ORAL_TABLET | Freq: Every evening | ORAL | Status: DC | PRN
  Administered 2015-02-18: 1 via ORAL
  Filled 2015-02-16: qty 1

## 2015-02-16 MED ORDER — ONDANSETRON HCL 4 MG/2ML IJ SOLN: 4.0000 mg | Freq: Four times a day (QID) | INTRAMUSCULAR | Status: DC | PRN

## 2015-02-16 MED ORDER — OXYCODONE HCL 5 MG PO TABS
5.0000 mg | ORAL_TABLET | ORAL | Status: DC | PRN
  Administered 2015-02-18 – 2015-02-21 (×9): 5 mg via ORAL
  Filled 2015-02-16 (×9): qty 1

## 2015-02-16 MED ORDER — METHIMAZOLE 5 MG PO TABS
5.0000 mg | ORAL_TABLET | Freq: Every day | ORAL | Status: DC
  Administered 2015-02-17 – 2015-02-21 (×5): 5 mg via ORAL
  Filled 2015-02-16 (×6): qty 1

## 2015-02-16 MED ORDER — SODIUM CHLORIDE 0.9 % IJ SOLN
3.0000 mL | Freq: Two times a day (BID) | INTRAMUSCULAR | Status: DC
  Administered 2015-02-18 – 2015-02-21 (×6): 3 mL via INTRAVENOUS

## 2015-02-16 MED ORDER — ONDANSETRON HCL 4 MG/2ML IJ SOLN: 4.0000 mg | Freq: Three times a day (TID) | INTRAMUSCULAR | Status: DC | PRN

## 2015-02-16 MED ORDER — HYDRALAZINE HCL 25 MG PO TABS
25.0000 mg | ORAL_TABLET | Freq: Three times a day (TID) | ORAL | Status: DC
  Administered 2015-02-16 – 2015-02-21 (×16): 25 mg via ORAL
  Filled 2015-02-16 (×19): qty 1

## 2015-02-16 MED ORDER — POTASSIUM CHLORIDE IN NACL 40-0.9 MEQ/L-% IV SOLN
INTRAVENOUS | Status: DC
  Administered 2015-02-16 – 2015-02-18 (×3): 75 mL/h via INTRAVENOUS
  Filled 2015-02-16 (×5): qty 1000

## 2015-02-16 MED ORDER — PNEUMOCOCCAL VAC POLYVALENT 25 MCG/0.5ML IJ INJ
0.5000 mL | INJECTION | INTRAMUSCULAR | Status: DC
  Filled 2015-02-16 (×2): qty 0.5

## 2015-02-16 NOTE — ED Notes (Signed)
Bed: WA06 Expected date: 02/16/15 Expected time: 12:27 PM Means of arrival: Ambulance Comments: Larey Seat 2 weeks ago, seen her and sent home, difficult to ambulate

## 2015-02-16 NOTE — ED Notes (Signed)
Pt fell at home 2 weeks ago.  Was evaluated at [facility] and was sent home.  C/o right hip pain, back pain and increased difficulty ambulating.

## 2015-02-16 NOTE — ED Provider Notes (Signed)
CSN: [id number]     Arrival date & time 02/16/15  1228 History   First MD Initiated Contact with Patient 02/16/15 1240     Chief Complaint  Patient presents with  . Fall    fell 2 weeks ago     (Consider location/radiation/quality/duration/timing/severity/associated sxs/prior Treatment) HPI Comments: The patient is an 81 year old female, she presents from home with her daughter. She had fallen approximately a week and a half ago and was seen in the emergency department, evaluated with CT scans of the brain and the cervical spine as well as an elbow x-ray which were negative. She has since gone home and has been doing poorly as she is unable to ambulate. Her daughter states that she has been checking on her frequently, she is not able to ambulate, complains of severe pain in the pelvis and now has some bruising in the left posterior thigh. There has been no recurrent falls, no fevers chills nausea vomiting or diarrhea. Appetite is decreased.  Patient is a 81 y.o. female presenting with fall. The history is provided by the patient.  Fall    Past Medical History  Diagnosis Date  . Hypertension   . Hyperlipemia   . Hyperthyroidism   . Arthritis   . Depression   . Intertrochanteric fracture of right femur 09/22/11    right; S/P fall  . Non-sustained ventricular tachycardia   . Third degree AV block   . Cerebrovascular disease     80 % left ICA  . Macular degeneration     Substantial visual impairment  . Peripheral vascular disease     Femoral bruit; decreased distal pulses   Past Surgical History  Procedure Laterality Date  . Appendectomy  1943  . Laryngeal nodule  ~ 1960's    removed  . Permanent pacemaker insertion Left 09/24/2011    Procedure: PERMANENT PACEMAKER INSERTION;  Surgeon: Marinus Maw, MD;  Location: [facility] CATH LAB;  Service: Cardiovascular;  Laterality: Left;   Family History  Problem Relation Age of Onset  . Diabetes Maternal Aunt   . Diabetes Maternal Uncle    . Cancer Maternal Grandmother    Social History  Substance Use Topics  . Smoking status: Former Smoker -- 1.00 packs/day for 66 years    Types: Cigarettes    Quit date: 06/21/2011  . Smokeless tobacco: Never Used  . Alcohol Use: No   OB History    No data available     Review of Systems  All other systems reviewed and are negative.     Allergies  Review of patient's allergies indicates no known allergies.  Home Medications   Prior to Admission medications   Medication Sig Start Date End Date Taking? Authorizing Provider  amLODipine (NORVASC) 5 MG tablet Take 5 mg by mouth daily.   Yes Historical Provider, MD  aspirin EC 325 MG tablet Take 325 mg by mouth daily.   Yes Historical Provider, MD  Calcium Carb-Cholecalciferol (CALCIUM 1000 + D PO) Take 1 tablet by mouth daily.    Yes Historical Provider, MD  cholecalciferol (VITAMIN D) 1000 UNITS tablet Take 1,000 Units by mouth daily.   Yes Historical Provider, MD  fish oil-omega-3 fatty acids 1000 MG capsule Take 1 g by mouth daily.   Yes Historical Provider, MD  furosemide (LASIX) 20 MG tablet Take 20 mg by mouth daily as needed for fluid or edema.   Yes Historical Provider, MD  lisinopril (PRINIVIL,ZESTRIL) 40 MG tablet Take 1 tablet by mouth daily.  11/27/14  Yes Historical Provider, MD  methimazole (TAPAZOLE) 5 MG tablet Take 5 mg by mouth daily.   Yes Historical Provider, MD  Multiple Vitamins-Minerals (OCUVITE PRESERVISION PO) Take 1 tablet by mouth daily.   Yes Historical Provider, MD  sertraline (ZOLOFT) 100 MG tablet Take 100 mg by mouth daily.   Yes Historical Provider, MD  simvastatin (ZOCOR) 20 MG tablet Take 20 mg by mouth every evening.   Yes Historical Provider, MD  vitamin B-12 (CYANOCOBALAMIN) 1000 MCG tablet Take 1,000 mcg by mouth daily.   Yes Historical Provider, MD  vitamin E 600 UNIT capsule Take 600 Units by mouth daily.   Yes Historical Provider, MD  hydrALAZINE (APRESOLINE) 25 MG tablet Take 1 tablet (25  mg total) by mouth every 8 (eight) hours. 10/24/12 12/25/14  Brooke O Edmisten, PA-C   BP 130/84 mmHg  Pulse 71  Temp(Src) 98.2 F (36.8 C) (Oral)  Resp 18  Ht  (1.473 m)  Wt 105 lb 9.6 oz (47.9 kg)  BMI 22.08 kg/m2  SpO2 98% Physical Exam  Constitutional: She appears well-developed and well-nourished. No distress.  HENT:  Head: Normocephalic and atraumatic.  Mouth/Throat: Oropharynx is clear and moist. No oropharyngeal exudate.  Eyes: Conjunctivae and EOM are normal. Pupils are equal, round, and reactive to light. Right eye exhibits no discharge. Left eye exhibits no discharge. No scleral icterus.  Neck: Normal range of motion. Neck supple. No JVD present. No thyromegaly present.  Cardiovascular: Normal rate, regular rhythm and intact distal pulses.  Exam reveals no gallop and no friction rub.   Murmur heard. Pulmonary/Chest: Effort normal and breath sounds normal. No respiratory distress. She has no wheezes. She has no rales.  Abdominal: Soft. Bowel sounds are normal. She exhibits no distension and no mass. There is no tenderness.  Musculoskeletal: Normal range of motion. She exhibits tenderness ( Tenderness with palpation over the pelvic brim, normal range of motion of the bilateral hips with minimal pain, able to straight leg raise bilaterally). She exhibits no edema.  Lymphadenopathy:    She has no cervical adenopathy.  Neurological: She is alert. Coordination normal.  Skin: Skin is warm and dry. No rash noted. No erythema.  Psychiatric: She has a normal mood and affect. Her behavior is normal.  Nursing note and vitals reviewed.   ED Course  Procedures (including critical care time) Labs Review Labs Reviewed  CBC - Abnormal; Notable for the following:    RBC 3.07 (*)    Hemoglobin 9.1 (*)    HCT 26.6 (*)    RDW 17.5 (*)    All other components within normal limits  BASIC METABOLIC PANEL - Abnormal; Notable for the following:    Sodium 128 (*)    Potassium 3.1 (*)     Chloride 93 (*)    Glucose, Bld 127 (*)    All other components within normal limits  CBC - Abnormal; Notable for the following:    WBC 11.6 (*)    RBC 3.26 (*)    Hemoglobin 9.2 (*)    HCT 28.3 (*)    RDW 17.5 (*)    All other components within normal limits  MAGNESIUM - Abnormal; Notable for the following:    Magnesium 1.5 (*)    All other components within normal limits  URINALYSIS, ROUTINE W REFLEX MICROSCOPIC (NOT AT [facility]) - Abnormal; Notable for the following:    APPearance CLOUDY (*)    Nitrite POSITIVE (*)    Leukocytes, UA LARGE (*)  All other components within normal limits  COMPREHENSIVE METABOLIC PANEL - Abnormal; Notable for the following:    Sodium 129 (*)    Chloride 97 (*)    Glucose, Bld 112 (*)    Calcium 8.5 (*)    Total Protein 5.8 (*)    Albumin 3.3 (*)    Total Bilirubin 1.9 (*)    All other components within normal limits  CBC - Abnormal; Notable for the following:    RBC 3.71 (*)    Hemoglobin 11.0 (*)    HCT 32.2 (*)    RDW 17.2 (*)    All other components within normal limits  URINE MICROSCOPIC-ADD ON - Abnormal; Notable for the following:    Squamous Epithelial / LPF FEW (*)    Bacteria, UA MANY (*)    All other components within normal limits  URINE CULTURE  CREATININE, SERUM  TSH  TROPONIN I  TROPONIN I  TROPONIN I  HEMOGLOBIN A1C  CBC WITH DIFFERENTIAL/PLATELET  COMPREHENSIVE METABOLIC PANEL  PROTIME-INR  MAGNESIUM  TYPE AND SCREEN  PREPARE RBC (CROSSMATCH)  ABO/RH    Imaging Review Ct Abdomen Pelvis Wo Contrast  02/16/2015   CLINICAL DATA:  Status post fall at home 2 weeks ago. Right hip pain, back pain and difficulty ambulating. Severe pelvic pain. Initial encounter.  EXAM: CT ABDOMEN AND PELVIS WITHOUT CONTRAST  TECHNIQUE: Multidetector CT imaging of the abdomen and pelvis was performed following the standard protocol without IV contrast.  COMPARISON:  Lumbar spine and pelvic radiographs performed earlier today at 1:50 p.m.   FINDINGS: Trace bilateral pleural effusions are noted, with bibasilar atelectasis, left greater than right. Dense calcification is noted at the mitral valve. Pacemaker leads are partially imaged. A small to moderate hiatal hernia is seen.  Scattered calcified granulomata are noted within the liver. The liver and spleen are otherwise unremarkable. The gallbladder is not definitely seen. The pancreas and adrenal glands are unremarkable.  There is diffuse calcification along the left renal artery, with marked left renal atrophy. The right kidney is grossly unremarkable in appearance, though difficult to fully assess due to motion artifact. There is no evidence of hydronephrosis. No renal or ureteral stones are seen. No perinephric stranding is appreciated.  No free fluid is identified. The small bowel is unremarkable in appearance. The stomach is within normal limits. No acute vascular abnormalities are seen. Diffuse calcification is seen along the abdominal aorta and its branches, including along the superior mesenteric artery and proximal inferior mesenteric artery. There is likely severe luminal narrowing at the distal abdominal aorta and along the common iliac arteries and external iliac arteries bilaterally.  The patient is status post appendectomy. Mild diverticulosis is noted along the sigmoid colon, without evidence of diverticulitis.  The bladder is mildly distended and grossly unremarkable. The uterus is grossly unremarkable in appearance. The ovaries are grossly symmetric. No suspicious adnexal masses are seen. No inguinal lymphadenopathy is seen. Nonspecific presacral soft tissue stranding is noted.  There are comminuted fractures involving the superior and inferior pubic rami bilaterally, with both medial and lateral fracture lines at the right superior pubic ramus, a medial fracture line at the left superior pubic ramus, a displaced fracture at the right inferior pubic ramus, and a comminuted fracture at  the left inferior pubic ramus. There is extension of fracture lines to the pubic symphysis on both sides. There appears to be a resolving hematoma just superior to the pubic symphysis, measuring approximately 5.6 x 2.7 x 1.5 cm.  Associated ossification within the hematoma is thought to reflect callus formation.  There is a mildly comminuted fracture involving the right sacral ala, extending to the right sacroiliac joint, without evidence of joint space widening. Right femoral hardware is incompletely imaged, but appears grossly unremarkable. Multilevel vacuum phenomenon is noted along the lower thoracic and lumbar spine. Endplate degenerative change is noted at the lower lumbar spine.  IMPRESSION: 1. Comminuted fractures involving the superior and inferior pubic rami bilaterally, as described above, with extension of fracture lines to the pubis symphysis on both sides. Associated resolving hematoma just superior to the pubic symphysis, measuring 5.6 x 2.7 x 1.5 cm. Ossification within the hematoma is thought to reflect callus formation. 2. Mildly comminuted fracture involving the right sacral ala, extending to the right sacroiliac joint, without evidence of joint space widening. 3. Diffuse calcification along the abdominal aorta and its branches, including along the superior mesenteric artery and proximal inferior mesenteric artery. Likely severe luminal narrowing noted at the distal abdominal aorta and along the common iliac arteries and external iliac arteries bilaterally. 4. Diffuse calcification along the left renal artery, with marked left renal atrophy. 5. Trace bilateral pleural effusions, with bibasilar atelectasis, left greater than right. 6. Dense calcification at the mitral valve. 7. Small to moderate hiatal hernia noted. 8. Mild diverticulosis along the sigmoid colon, without evidence of diverticulitis. These results were called by telephone at the time of interpretation on 02/16/2015 at 6:19 pm to [facility] on [id number], who verbally acknowledged these results.   Electronically Signed   By: Roanna Raider M.D.   On: 02/16/2015 18:23   Dg Thoracic Spine W/swimmers  02/16/2015   CLINICAL DATA:  Fall 2 weeks ago with persistent back pain, initial encounter  EXAM: THORACIC SPINE - 3 VIEWS  COMPARISON:  None.  FINDINGS: A compression deformity is again noted at T11. No other compression deformities are seen. Degenerative changes are seen. Diffuse aortic calcifications are noted. Degenerative changes in the visualized cervical spine are noted as well.  IMPRESSION: Chronic appearing compression deformity at T11. No other focal abnormality is seen.   Electronically Signed   By: Alcide Clever M.D.   On: 02/16/2015 14:24   Dg Lumbar Spine Complete  02/16/2015   CLINICAL DATA:  Difficulty ambulating  EXAM: LUMBAR SPINE - COMPLETE 4+ VIEW  COMPARISON:  None.  FINDINGS: Five lumbar type vertebral bodies are well visualized. Vertebral body height is well maintained. Disc space narrowing with endplate sclerosis is noted most marked at the L4-5 interspace. Mild compression deformities noted at T11 of uncertain chronicity. No soft tissue abnormality is seen. Facet hypertrophic changes are noted.  IMPRESSION: Multilevel degenerative changes without acute abnormality.   Electronically Signed   By: Alcide Clever M.D.   On: 02/16/2015 14:22   Dg Pelvis 1-2 Views  02/16/2015   CLINICAL DATA:  Fall 2 weeks ago with increasing difficulty with ambulation and right hip pain  EXAM: PELVIS - 1-2 VIEW  COMPARISON:  09/23/2011  FINDINGS: Medullary rod with compression screw is seen in the proximal right femur. The pelvic ring is intact. No definitive acute fracture is seen. Degenerative changes of the lumbar spine are noted.  IMPRESSION: Chronic changes without acute abnormality.   Electronically Signed   By: Alcide Clever M.D.   On: 02/16/2015 14:21   Portable Chest 1 View  02/16/2015   CLINICAL DATA:  Patient status post fall.  EXAM:  PORTABLE CHEST - 1 VIEW  COMPARISON:  Chest radiograph  09/27/2011; thoracic spine radiograph 02/16/2015  FINDINGS: Dual lead pacer apparatus overlies the left hemi thorax. Stable enlarged cardiac and mediastinal contours. There is a rounded density projecting over the expected location of the central mediastinum. Lungs are hyperexpanded. Bilateral interstitial pulmonary opacities. Suggestion of possible spiculated mass within the right lower lung. Irregular appearance of the proximal right humerus. Age-indeterminate but likely old lateral right rib fractures. Carotid vascular calcifications.  IMPRESSION: Possible spiculated mass within right lower lobe. Recommend dedicated evaluation with contrast-enhanced chest CT.  Rounded density projecting over the central mediastinum is nonspecific and may be secondary to patient rotation. Recommend attention on above recommended CT.  Irregularity of the proximal right humerus. Recommend correlation with dedicated humeral radiographs to exclude fracture.  Age-indeterminate but likely old right lateral rib fractures.   Electronically Signed   By: Annia Belt M.D.   On: 02/16/2015 17:30   I have personally reviewed and evaluated these images and lab results as part of my medical decision-making.    MDM   Final diagnoses:  Fall  Pelvic pain in female  Right lower lobe lung mass     patient has likely suffered a pelvic fracture. We'll obtain imaging, labs, discussed with the hospitalist regarding admission as the patient is unable to ambulate and will need to have higher level of care and maybe physical therapy.  Labs and x-rays discussed with the hospitalist prior to admission.  Holding orders written  Appreciate hospitalist consultation.  Eber Hong, MD 02/17/15 [phone]

## 2015-02-16 NOTE — ED Notes (Signed)
Report called to Patty, RN [address]  

## 2015-02-16 NOTE — H&P (Signed)
[facility] History and Physical  MARNEY TRELOAR ZOX:[id number] DOB: 1922/07/18 DOA: 02/16/2015  Referring physician:  PCP: Sissy Hoff, MD   Chief Complaint: Follow HPI:  81 year old female with a history of hypertension, depression, prior history of right intertrochanteric fracture in 2013, third-degree AV block with a pacemaker, chronic chest pain at the pacemaker site who presented with a fall on 8/12. Patient Hardy doorbell ring, that startled her and the patient tripped and fell. According to the patient's daughter her story has not been reliable, on 8/12 she denied any pain in her legs. She denied any dizziness or chest pain prior to the fall. She denied any neck pain. CT head and C-spine that did not show any acute intracranial abnormalities were obvious fractures of the cervical spine. X-rays of the left elbow were also negative. Patient was found to be neurovascularly intact and discharged home with the daughter and was advised to take Tylenol and Aleve. She presents today with difficulty ambulation since 8/12. She also has a large bruise on her left posterior thigh. Daughter states that the patient has just not been able to get up and ambulate. Her dementia prohibits a reliable history.     Review of Systems: negative for the following  Constitutional: Denies fever, chills, diaphoresis, appetite change and fatigue.  HEENT: Denies photophobia, eye pain, redness, hearing loss, ear pain, congestion, sore throat, rhinorrhea, sneezing, mouth sores, trouble swallowing, neck pain, neck stiffness and tinnitus.  Respiratory: Denies SOB, DOE, cough, chest tightness, and wheezing.  Cardiovascular: Denies chest pain, palpitations and leg swelling.  Gastrointestinal: Positive for nausea and vomiting (pt relates one episode of vomiting yesterday).  Musculoskeletal: Positive for myalgias (bilateral thighs) and arthralgias (L elbow). Negative for back pain, neck pain and neck  stiffness Genitourinary: Denies dysuria, urgency, frequency, hematuria, flank pain and difficulty urinating.  Neurological: Denies dizziness, seizures, syncope, weakness, light-headedness, numbness and headaches.  Hematological: Denies adenopathy. Easy bruising, personal or family bleeding history  Psychiatric/Behavioral: Denies suicidal ideation, mood changes, confusion, nervousness, sleep disturbance and agitation       Past Medical History  Diagnosis Date  . Hypertension   . Hyperlipemia   . Hyperthyroidism   . Arthritis   . Depression   . Intertrochanteric fracture of right femur 09/22/11    right; S/P fall  . Non-sustained ventricular tachycardia   . Third degree AV block   . Cerebrovascular disease     80 % left ICA  . Macular degeneration     Substantial visual impairment  . Peripheral vascular disease     Femoral bruit; decreased distal pulses     Past Surgical History  Procedure Laterality Date  . Appendectomy  1943  . Laryngeal nodule  ~ 1960's    removed  . Permanent pacemaker insertion Left 09/24/2011    Procedure: PERMANENT PACEMAKER INSERTION;  Surgeon: Marinus Maw, MD;  Location: [facility] CATH LAB;  Service: Cardiovascular;  Laterality: Left;      Social History:  reports that she quit smoking about 3 years ago. Her smoking use included Cigarettes. She has a 66 pack-year smoking history. She has never used smokeless tobacco. She reports that she does not drink alcohol or use illicit drugs.    No Known Allergies  Family History  Problem Relation Age of Onset  . Diabetes Maternal Aunt   . Diabetes Maternal Uncle   . Cancer Maternal Grandmother        Prior to Admission medications   Medication Sig Start Date End  Date Taking? Authorizing Provider  amLODipine (NORVASC) 5 MG tablet Take 5 mg by mouth daily.   Yes Historical Provider, MD  aspirin EC 325 MG tablet Take 325 mg by mouth daily.   Yes Historical Provider, MD  Calcium Carb-Cholecalciferol  (CALCIUM 1000 + D PO) Take 1 tablet by mouth daily.    Yes Historical Provider, MD  cholecalciferol (VITAMIN D) 1000 UNITS tablet Take 1,000 Units by mouth daily.   Yes Historical Provider, MD  fish oil-omega-3 fatty acids 1000 MG capsule Take 1 g by mouth daily.   Yes Historical Provider, MD  furosemide (LASIX) 20 MG tablet Take 20 mg by mouth daily as needed for fluid or edema.   Yes Historical Provider, MD  lisinopril (PRINIVIL,ZESTRIL) 40 MG tablet Take 1 tablet by mouth daily. 11/27/14  Yes Historical Provider, MD  methimazole (TAPAZOLE) 5 MG tablet Take 5 mg by mouth daily.   Yes Historical Provider, MD  Multiple Vitamins-Minerals (OCUVITE PRESERVISION PO) Take 1 tablet by mouth daily.   Yes Historical Provider, MD  sertraline (ZOLOFT) 100 MG tablet Take 100 mg by mouth daily.   Yes Historical Provider, MD  simvastatin (ZOCOR) 20 MG tablet Take 20 mg by mouth every evening.   Yes Historical Provider, MD  vitamin B-12 (CYANOCOBALAMIN) 1000 MCG tablet Take 1,000 mcg by mouth daily.   Yes Historical Provider, MD  vitamin E 600 UNIT capsule Take 600 Units by mouth daily.   Yes Historical Provider, MD  hydrALAZINE (APRESOLINE) 25 MG tablet Take 1 tablet (25 mg total) by mouth every 8 (eight) hours. 10/24/12 12/25/14  Minda Meo, PA-C     Physical Exam: Filed Vitals:   02/16/15 1251 02/16/15 1526 02/16/15 1720  BP: 101/60 124/55 119/43  Pulse: 79 69 74  Temp: 98.1 F (36.7 C) 98.3 F (36.8 C) 98.4 F (36.9 C)  TempSrc: Oral Oral Oral  Resp: 16  18  Height:   4\' 10"  (1.473 m)  Weight:   47.9 kg (105 lb 9.6 oz)  SpO2: 96% 97% 96%     Constitutional: Vital signs reviewed. Patient is a well-developed and well-nourished in no acute distress and cooperative with exam. Alert and oriented x3.  Head: Normocephalic and atraumatic  Ear: TM normal bilaterally  Mouth: no erythema or exudates, MMM  Eyes: PERRL, EOMI, conjunctivae normal, No scleral icterus.  Neck: Supple, Trachea midline  normal ROM, No JVD, mass, thyromegaly, or carotid bruit present.  Cardiovascular: RRR, S1 normal, S2 normal, no MRG, pulses symmetric and intact bilaterally  Pulmonary/Chest: CTAB, no wheezes, rales, or rhonchi  Abdominal: Soft. Non-tender, non-distended, bowel sounds are normal, no masses, organomegaly, or guarding present.  GU: no CVA tenderness Musculoskeletal: Normal range of motion. She exhibits tenderness (mild L elbow tenderness. Mild bilateral thigh ttp, no obvious deformity).  Neurological: A&O x3, Strenght is normal and symmetric bilaterally, cranial nerve II-XII are grossly intact, no focal motor deficit, sensory intact to light touch bilaterally.  Skin: Warm, dry and intact. No rash, cyanosis, or clubbing.  Psychiatric: Normal mood and affect. speech and behavior is normal. Judgment and thought content normal. Cognition and memory are normal.      Data Review   Micro Results No results found for this or any previous visit (from the past 240 hour(s)).  Radiology Reports Ct Abdomen Pelvis Wo Contrast  02/16/2015   CLINICAL DATA:  Status post fall at home 2 weeks ago. Right hip pain, back pain and difficulty ambulating. Severe pelvic pain. Initial encounter.  EXAM: CT ABDOMEN AND PELVIS WITHOUT CONTRAST  TECHNIQUE: Multidetector CT imaging of the abdomen and pelvis was performed following the standard protocol without IV contrast.  COMPARISON:  Lumbar spine and pelvic radiographs performed earlier today at 1:50 p.m.  FINDINGS: Trace bilateral pleural effusions are noted, with bibasilar atelectasis, left greater than right. Dense calcification is noted at the mitral valve. Pacemaker leads are partially imaged. A small to moderate hiatal hernia is seen.  Scattered calcified granulomata are noted within the liver. The liver and spleen are otherwise unremarkable. The gallbladder is not definitely seen. The pancreas and adrenal glands are unremarkable.  There is diffuse calcification along  the left renal artery, with marked left renal atrophy. The right kidney is grossly unremarkable in appearance, though difficult to fully assess due to motion artifact. There is no evidence of hydronephrosis. No renal or ureteral stones are seen. No perinephric stranding is appreciated.  No free fluid is identified. The small bowel is unremarkable in appearance. The stomach is within normal limits. No acute vascular abnormalities are seen. Diffuse calcification is seen along the abdominal aorta and its branches, including along the superior mesenteric artery and proximal inferior mesenteric artery. There is likely severe luminal narrowing at the distal abdominal aorta and along the common iliac arteries and external iliac arteries bilaterally.  The patient is status post appendectomy. Mild diverticulosis is noted along the sigmoid colon, without evidence of diverticulitis.  The bladder is mildly distended and grossly unremarkable. The uterus is grossly unremarkable in appearance. The ovaries are grossly symmetric. No suspicious adnexal masses are seen. No inguinal lymphadenopathy is seen. Nonspecific presacral soft tissue stranding is noted.  There are comminuted fractures involving the superior and inferior pubic rami bilaterally, with both medial and lateral fracture lines at the right superior pubic ramus, a medial fracture line at the left superior pubic ramus, a displaced fracture at the right inferior pubic ramus, and a comminuted fracture at the left inferior pubic ramus. There is extension of fracture lines to the pubic symphysis on both sides. There appears to be a resolving hematoma just superior to the pubic symphysis, measuring approximately 5.6 x 2.7 x 1.5 cm. Associated ossification within the hematoma is thought to reflect callus formation.  There is a mildly comminuted fracture involving the right sacral ala, extending to the right sacroiliac joint, without evidence of joint space widening. Right  femoral hardware is incompletely imaged, but appears grossly unremarkable. Multilevel vacuum phenomenon is noted along the lower thoracic and lumbar spine. Endplate degenerative change is noted at the lower lumbar spine.  IMPRESSION: 1. Comminuted fractures involving the superior and inferior pubic rami bilaterally, as described above, with extension of fracture lines to the pubis symphysis on both sides. Associated resolving hematoma just superior to the pubic symphysis, measuring 5.6 x 2.7 x 1.5 cm. Ossification within the hematoma is thought to reflect callus formation. 2. Mildly comminuted fracture involving the right sacral ala, extending to the right sacroiliac joint, without evidence of joint space widening. 3. Diffuse calcification along the abdominal aorta and its branches, including along the superior mesenteric artery and proximal inferior mesenteric artery. Likely severe luminal narrowing noted at the distal abdominal aorta and along the common iliac arteries and external iliac arteries bilaterally. 4. Diffuse calcification along the left renal artery, with marked left renal atrophy. 5. Trace bilateral pleural effusions, with bibasilar atelectasis, left greater than right. 6. Dense calcification at the mitral valve. 7. Small to moderate hiatal hernia noted. 8. Mild diverticulosis along  the sigmoid colon, without evidence of diverticulitis. These results were called by telephone at the time of interpretation on 02/16/2015 at 6:19 pm to [facility] on [facility], who verbally acknowledged these results.   Electronically Signed   By: Roanna Raider M.D.   On: 02/16/2015 18:23   Dg Thoracic Spine W/swimmers  02/16/2015   CLINICAL DATA:  Fall 2 weeks ago with persistent back pain, initial encounter  EXAM: THORACIC SPINE - 3 VIEWS  COMPARISON:  None.  FINDINGS: A compression deformity is again noted at T11. No other compression deformities are seen. Degenerative changes are seen. Diffuse aortic calcifications are  noted. Degenerative changes in the visualized cervical spine are noted as well.  IMPRESSION: Chronic appearing compression deformity at T11. No other focal abnormality is seen.   Electronically Signed   By: Alcide Clever M.D.   On: 02/16/2015 14:24   Dg Lumbar Spine Complete  02/16/2015   CLINICAL DATA:  Difficulty ambulating  EXAM: LUMBAR SPINE - COMPLETE 4+ VIEW  COMPARISON:  None.  FINDINGS: Five lumbar type vertebral bodies are well visualized. Vertebral body height is well maintained. Disc space narrowing with endplate sclerosis is noted most marked at the L4-5 interspace. Mild compression deformities noted at T11 of uncertain chronicity. No soft tissue abnormality is seen. Facet hypertrophic changes are noted.  IMPRESSION: Multilevel degenerative changes without acute abnormality.   Electronically Signed   By: Alcide Clever M.D.   On: 02/16/2015 14:22   Dg Pelvis 1-2 Views  02/16/2015   CLINICAL DATA:  Fall 2 weeks ago with increasing difficulty with ambulation and right hip pain  EXAM: PELVIS - 1-2 VIEW  COMPARISON:  09/23/2011  FINDINGS: Medullary rod with compression screw is seen in the proximal right femur. The pelvic ring is intact. No definitive acute fracture is seen. Degenerative changes of the lumbar spine are noted.  IMPRESSION: Chronic changes without acute abnormality.   Electronically Signed   By: Alcide Clever M.D.   On: 02/16/2015 14:21   Dg Elbow Complete Left  02/08/2015   CLINICAL DATA:  Fall  EXAM: LEFT ELBOW - COMPLETE 3+ VIEW  COMPARISON:  None.  FINDINGS: Five views of the left elbow submitted. No acute fracture or subluxation. No posterior fat pad sign.  IMPRESSION: Negative.   Electronically Signed   By: Natasha Mead M.D.   On: 02/08/2015 15:30   Ct Head Wo Contrast  02/08/2015   CLINICAL DATA:  Larey Seat yesterday onto LEFT side, tripped when startled by door bell, struck head, no loss of consciousness, history hypertension, hyperlipidemia, third-degree heart block, former smoker   EXAM: CT HEAD WITHOUT CONTRAST  CT CERVICAL SPINE WITHOUT CONTRAST  TECHNIQUE: Multidetector CT imaging of the head and cervical spine was performed following the standard protocol without intravenous contrast. Multiplanar CT image reconstructions of the cervical spine were also generated.  COMPARISON:  10/14/2011  FINDINGS: CT HEAD FINDINGS  Generalized atrophy.  Diffuse ex vacuo dilatation of ventricular system, unchanged.  No midline shift or mass effect.  Small vessel chronic ischemic changes of deep cerebral white matter.  Old RIGHT basal ganglia lacunar infarcts.  No intracranial hemorrhage, mass lesion or evidence acute infarction.  No extra-axial fluid collections.  Stable calcification at medial RIGHT frontal lobe.  Bones demineralized without identified fracture.  Atherosclerotic calcification of internal carotid and vertebral arteries at skullbase.  CT CERVICAL SPINE FINDINGS  Extensive atherosclerotic calcification in the carotid systems.  Multiple BILATERAL thyroid nodules again identified grossly unchanged since 2013.  Visualized skullbase intact.  Prevertebral soft tissues normal thickness.  Multilevel disc space narrowing and endplate spur formation cervical spine.  Vertebral body heights maintained without fracture or subluxation.  Diffuse multilevel facet degenerative changes as well.  Small amount of calcified pannus surrounding odontoid process.  Lung apices clear.  IMPRESSION: Generalized atrophy with ex vacuo dilatation of the ventricular system.  Small vessel chronic ischemic changes of deep cerebral white matter.  Old RIGHT basal ganglia lacunar infarcts.  No acute intracranial abnormalities.  Osseous demineralization with multilevel degenerative disc and facet disease changes of the cervical spine.  No acute cervical spine abnormalities.   Electronically Signed   By: Ulyses Southward M.D.   On: 02/08/2015 16:13   Ct Cervical Spine Wo Contrast  02/08/2015   CLINICAL DATA:  Larey Seat yesterday onto  LEFT side, tripped when startled by door bell, struck head, no loss of consciousness, history hypertension, hyperlipidemia, third-degree heart block, former smoker  EXAM: CT HEAD WITHOUT CONTRAST  CT CERVICAL SPINE WITHOUT CONTRAST  TECHNIQUE: Multidetector CT imaging of the head and cervical spine was performed following the standard protocol without intravenous contrast. Multiplanar CT image reconstructions of the cervical spine were also generated.  COMPARISON:  10/14/2011  FINDINGS: CT HEAD FINDINGS  Generalized atrophy.  Diffuse ex vacuo dilatation of ventricular system, unchanged.  No midline shift or mass effect.  Small vessel chronic ischemic changes of deep cerebral white matter.  Old RIGHT basal ganglia lacunar infarcts.  No intracranial hemorrhage, mass lesion or evidence acute infarction.  No extra-axial fluid collections.  Stable calcification at medial RIGHT frontal lobe.  Bones demineralized without identified fracture.  Atherosclerotic calcification of internal carotid and vertebral arteries at skullbase.  CT CERVICAL SPINE FINDINGS  Extensive atherosclerotic calcification in the carotid systems.  Multiple BILATERAL thyroid nodules again identified grossly unchanged since 2013.  Visualized skullbase intact.  Prevertebral soft tissues normal thickness.  Multilevel disc space narrowing and endplate spur formation cervical spine.  Vertebral body heights maintained without fracture or subluxation.  Diffuse multilevel facet degenerative changes as well.  Small amount of calcified pannus surrounding odontoid process.  Lung apices clear.  IMPRESSION: Generalized atrophy with ex vacuo dilatation of the ventricular system.  Small vessel chronic ischemic changes of deep cerebral white matter.  Old RIGHT basal ganglia lacunar infarcts.  No acute intracranial abnormalities.  Osseous demineralization with multilevel degenerative disc and facet disease changes of the cervical spine.  No acute cervical spine  abnormalities.   Electronically Signed   By: Ulyses Southward M.D.   On: 02/08/2015 16:13   Portable Chest 1 View  02/16/2015   CLINICAL DATA:  Patient status post fall.  EXAM: PORTABLE CHEST - 1 VIEW  COMPARISON:  Chest radiograph 09/27/2011; thoracic spine radiograph 02/16/2015  FINDINGS: Dual lead pacer apparatus overlies the left hemi thorax. Stable enlarged cardiac and mediastinal contours. There is a rounded density projecting over the expected location of the central mediastinum. Lungs are hyperexpanded. Bilateral interstitial pulmonary opacities. Suggestion of possible spiculated mass within the right lower lung. Irregular appearance of the proximal right humerus. Age-indeterminate but likely old lateral right rib fractures. Carotid vascular calcifications.  IMPRESSION: Possible spiculated mass within right lower lobe. Recommend dedicated evaluation with contrast-enhanced chest CT.  Rounded density projecting over the central mediastinum is nonspecific and may be secondary to patient rotation. Recommend attention on above recommended CT.  Irregularity of the proximal right humerus. Recommend correlation with dedicated humeral radiographs to exclude fracture.  Age-indeterminate but likely old right  lateral rib fractures.   Electronically Signed   By: Annia Belt M.D.   On: 02/16/2015 17:30     CBC  Recent Labs Lab 02/16/15 1326 02/16/15 1631  WBC 9.0 11.6*  HGB 9.1* 9.2*  HCT 26.6* 28.3*  PLT 375 379  MCV 86.6 86.8  MCH 29.6 28.2  MCHC 34.2 32.5  RDW 17.5* 17.5*    Chemistries   Recent Labs Lab 02/16/15 1326 02/16/15 1631  NA 128*  --   K 3.1*  --   CL 93*  --   CO2 28  --   GLUCOSE 127*  --   BUN 18  --   CREATININE 0.75 0.68  CALCIUM 9.0  --   MG  --  1.5*   ------------------------------------------------------------------------------------------------------------------ estimated creatinine clearance is 30.8 mL/min (by C-G formula based on Cr of  0.68). ------------------------------------------------------------------------------------------------------------------ No results for input(s): HGBA1C in the last 72 hours. ------------------------------------------------------------------------------------------------------------------ No results for input(s): CHOL, HDL, LDLCALC, TRIG, CHOLHDL, LDLDIRECT in the last 72 hours. ------------------------------------------------------------------------------------------------------------------  Recent Labs  02/16/15 1631  TSH 2.881   ------------------------------------------------------------------------------------------------------------------ No results for input(s): VITAMINB12, FOLATE, FERRITIN, TIBC, IRON, RETICCTPCT in the last 72 hours.  Coagulation profile No results for input(s): INR, PROTIME in the last 168 hours.  No results for input(s): DDIMER in the last 72 hours.  Cardiac Enzymes  Recent Labs Lab 02/16/15 1631  TROPONINI 0.03   ------------------------------------------------------------------------------------------------------------------ Invalid input(s): POCBNP   CBG: No results for input(s): GLUCAP in the last 168 hours.     EKG: Independently reviewe essment/Plan Principal Problem:   Multiple pelvic fractures-CT scan shows comminuted fractures involving the superior and inferior pubic rami, strict bedrest, patient also has hematoma around pubic smphysis.     Acute blood loss anemia likely secondary to hematoma from the fall, hg dropped from 12.0-9.2, will transfuse 1 unit of packed red blood cells    Hypertension-blood pressure is soft, continue hydralazine, hold other antihypertensives medications    Hyperthyroidism-check TSH and continue Tapazole    Third degree AV block-patient has a pacemaker, probably will need interrogation, place on telemetry    Peripheral vascular disease    Acute hyponatremia-according to the daughter the patient has  been dehydrated and not eating well, will hydrate the patient with normal saline. Chest x-ray shows spiculated mass, follow-up with a CT scan of the chest if daughter is interested in pursuing further workup    Hypokalemia-replete, magnesium 1.5, replete    Fall-PT OT evaluation   Code Status:   full Family Communication: bedside Disposition Plan: admit   Total time spent 55 minutes.Greater than 50% of this time was spent in counseling, explanation of diagnosis, planning of further management, and coordination of care  [facility] Pager [phone]  If 7PM-7AM, please contact night-coverage [email or web address] Password [email or web address] 02/16/2015, 7:12 PM

## 2015-02-17 ENCOUNTER — Inpatient Hospital Stay (HOSPITAL_COMMUNITY): Payer: Medicare Other

## 2015-02-17 DIAGNOSIS — Z950 Presence of cardiac pacemaker: Secondary | ICD-10-CM

## 2015-02-17 DIAGNOSIS — I739 Peripheral vascular disease, unspecified: Secondary | ICD-10-CM

## 2015-02-17 DIAGNOSIS — W19XXXA Unspecified fall, initial encounter: Secondary | ICD-10-CM

## 2015-02-17 DIAGNOSIS — R0600 Dyspnea, unspecified: Secondary | ICD-10-CM

## 2015-02-17 DIAGNOSIS — E0590 Thyrotoxicosis, unspecified without thyrotoxic crisis or storm: Secondary | ICD-10-CM

## 2015-02-17 DIAGNOSIS — I442 Atrioventricular block, complete: Secondary | ICD-10-CM

## 2015-02-17 DIAGNOSIS — L8990 Pressure ulcer of unspecified site, unspecified stage: Secondary | ICD-10-CM

## 2015-02-17 DIAGNOSIS — R102 Pelvic and perineal pain: Secondary | ICD-10-CM

## 2015-02-17 DIAGNOSIS — E876 Hypokalemia: Secondary | ICD-10-CM

## 2015-02-17 LAB — URINALYSIS, ROUTINE W REFLEX MICROSCOPIC
Bilirubin Urine: NEGATIVE
GLUCOSE, UA: NEGATIVE mg/dL
HGB URINE DIPSTICK: NEGATIVE
KETONES UR: NEGATIVE mg/dL
Nitrite: POSITIVE — AB
PROTEIN: NEGATIVE mg/dL
Specific Gravity, Urine: 1.011 (ref 1.005–1.030)
UROBILINOGEN UA: 0.2 mg/dL (ref 0.0–1.0)
pH: 7.5 (ref 5.0–8.0)

## 2015-02-17 LAB — COMPREHENSIVE METABOLIC PANEL
ALT: 15 U/L (ref 14–54)
ANION GAP: 7 (ref 5–15)
AST: 28 U/L (ref 15–41)
Albumin: 3.3 g/dL — ABNORMAL LOW (ref 3.5–5.0)
Alkaline Phosphatase: 70 U/L (ref 38–126)
BUN: 15 mg/dL (ref 6–20)
CHLORIDE: 97 mmol/L — AB (ref 101–111)
CO2: 25 mmol/L (ref 22–32)
Calcium: 8.5 mg/dL — ABNORMAL LOW (ref 8.9–10.3)
Creatinine, Ser: 0.73 mg/dL (ref 0.44–1.00)
Glucose, Bld: 112 mg/dL — ABNORMAL HIGH (ref 65–99)
POTASSIUM: 3.7 mmol/L (ref 3.5–5.1)
SODIUM: 129 mmol/L — AB (ref 135–145)
Total Bilirubin: 1.9 mg/dL — ABNORMAL HIGH (ref 0.3–1.2)
Total Protein: 5.8 g/dL — ABNORMAL LOW (ref 6.5–8.1)

## 2015-02-17 LAB — URINE MICROSCOPIC-ADD ON

## 2015-02-17 LAB — TROPONIN I

## 2015-02-17 LAB — CBC
HEMATOCRIT: 32.2 % — AB (ref 36.0–46.0)
HEMOGLOBIN: 11 g/dL — AB (ref 12.0–15.0)
MCH: 29.6 pg (ref 26.0–34.0)
MCHC: 34.2 g/dL (ref 30.0–36.0)
MCV: 86.8 fL (ref 78.0–100.0)
PLATELETS: 357 10*3/uL (ref 150–400)
RBC: 3.71 MIL/uL — AB (ref 3.87–5.11)
RDW: 17.2 % — ABNORMAL HIGH (ref 11.5–15.5)
WBC: 10.1 10*3/uL (ref 4.0–10.5)

## 2015-02-17 MED ORDER — LISINOPRIL 20 MG PO TABS
40.0000 mg | ORAL_TABLET | Freq: Every day | ORAL | Status: DC
  Administered 2015-02-17 – 2015-02-21 (×5): 40 mg via ORAL
  Filled 2015-02-17 (×5): qty 2

## 2015-02-17 MED ORDER — CALCIUM CARBONATE-VITAMIN D 500-200 MG-UNIT PO TABS
2.0000 | ORAL_TABLET | Freq: Every day | ORAL | Status: DC
  Administered 2015-02-17 – 2015-02-21 (×5): 2 via ORAL
  Filled 2015-02-17 (×5): qty 2

## 2015-02-17 MED ORDER — AMLODIPINE BESYLATE 5 MG PO TABS
5.0000 mg | ORAL_TABLET | Freq: Every day | ORAL | Status: DC
  Administered 2015-02-17 – 2015-02-21 (×5): 5 mg via ORAL
  Filled 2015-02-17 (×5): qty 1

## 2015-02-17 MED ORDER — SIMVASTATIN 20 MG PO TABS
20.0000 mg | ORAL_TABLET | Freq: Every evening | ORAL | Status: DC
  Administered 2015-02-17 – 2015-02-20 (×4): 20 mg via ORAL
  Filled 2015-02-17 (×4): qty 1

## 2015-02-17 MED ORDER — IOHEXOL 300 MG/ML  SOLN
100.0000 mL | Freq: Once | INTRAMUSCULAR | Status: AC | PRN
  Administered 2015-02-17: 80 mL via INTRAVENOUS

## 2015-02-17 NOTE — Progress Notes (Signed)
Asked pt today if she felt like she could get out of bed. She stated that she just didn't feel like she could do it. Still awaiting PT eval. Will continue to encourage pt to get out of bed as able per MD orders.   Arta Bruce [facility] 02/17/2015 7:13 PM

## 2015-02-17 NOTE — Progress Notes (Signed)
Spoken with Dr. Mahala Menghini re pelvic fxs.   Films reviewed, appears like a stable osteoporotic pelvic ring fracture, not visible on plain films, only on CT.  It does involve the sacral ala and pubic ramus, but I would not recommend surgery or any limitations due to her advanced age and risk of being bed bound.   OK to WBAT, no bracing indicated, and would recheck radiographs after ambulation in a couple of days, and/or see me in the office in ~1 week.  Eulas Post, MD

## 2015-02-17 NOTE — Progress Notes (Signed)
  Echocardiogram 2D Echocardiogram has been performed.  Kathryn [name] 02/17/2015, 11:50 AM

## 2015-02-17 NOTE — Progress Notes (Addendum)
Kathryn [name] ZOX:[id number] DOB: 01-Jan-1923 DOA: 02/16/2015 PCP: Kathryn Hoff, MD  Brief narrative: 81 y/o ? 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  Ration initially presented to the emergency room on 8/12 and had a fall she got startled and had a mechanical fall Usually at baseline she uses a cane and is supposed to use a walker but is not doing that She had hit her head and a CT scan head and C-spine did not show any abnormalities she was intact and x-ray did not show any bony fractures or dislocations at the time Represented to the emergency room because of inability to ambulate and was found to have multiple pelvic insufficiency fractures  Past medical history-As per Problem list Chart reviewed as below-   Consultants:  Orthopedics  Procedures: CT 1. Comminuted fractures involving the superior and inferior pubic rami bilaterally, as described above, with extension of fracture lines to the pubis symphysis on both sides. Associated resolving hematoma just superior to the pubic symphysis, measuring 5.6 x 2.7 x 1.5 cm. Ossification within the hematoma is thought to reflect callus formation. 2. Mildly comminuted fracture involving the right sacral ala, extending to the right sacroiliac joint, without evidence of joint space widening. 3. Diffuse calcification along the abdominal aorta and its branches, including along the superior mesenteric artery and proximal inferior mesenteric artery. Likely severe luminal narrowing noted at the distal abdominal aorta and along the common iliac arteries and external iliac arteries bilaterally. 4. Diffuse calcification along the left renal artery, with marked left renal  atrophy. 5. Trace bilateral pleural effusions, with bibasilar atelectasis, left greater than right. 6. Dense calcification at the mitral valve. 7. Small to moderate hiatal hernia noted. 8. Mild diverticulosis along the sigmoid colon, without evidence of  diverticulitis.  Antibiotics:     Subjective   Upset she didn't get to eat yesterday Good appetite at bedside this am-Tolerating diet well  Pain is moderately controlled No chest pain, no diarrhea, no blurred vision, no double vision   Objective    Interim History:   Telemetry: Paced r, PVC   Objective: Filed Vitals:   02/16/15 2009 02/17/15 0142 02/17/15 0210 02/17/15 0528  BP: 92/29 125/58 124/86 130/63  Pulse: 104 70 73 67  Temp: 98.3 F (36.8 C) 98.4 F (36.9 C) 98.2 F (36.8 C) 98 F (36.7 C)  TempSrc: Oral Oral Oral Oral  Resp:  16 18 18   Height:      Weight:      SpO2: 98% 97% 96% 97%    Intake/Output Summary (Last 24 hours) at 02/17/15 8119 Last data filed at 02/17/15 1478  Gross per 24 hour  Intake    360 ml  Output     75 ml  Net    285 ml    Exam:  General: eomi, ncat edentulous, no pallor nor ict, pleasant alert x 4 Cardiovascular: S1-S2 paced rhythm Respiratory: Clinically clear no added sound significant kyphosis Abdomen: Soft nontender nondistended no rebound Skin no lower extremity edema Neuro grossly intact MSK-pain on flexion of knee to chest, radiating down to the back of the knee  Data Reviewed: Basic Metabolic Panel:  Recent Labs Lab 02/16/15 1326 02/16/15 1631 02/17/15 0806  NA 128*  --  129*  K  3.1*  --  3.7  CL 93*  --  97*  CO2 28  --  25  GLUCOSE 127*  --  112*  BUN 18  --  15  CREATININE 0.75 0.68 0.73  CALCIUM 9.0  --  8.5*  MG  --  1.5*  --    Liver Function Tests:  Recent Labs Lab 02/17/15 0806  AST 28  ALT 15  ALKPHOS 70  BILITOT 1.9*  PROT 5.8*  ALBUMIN 3.3*   No results for input(s): LIPASE, AMYLASE in the last 168 hours. No results for  input(s): AMMONIA in the last 168 hours. CBC:  Recent Labs Lab 02/16/15 1326 02/16/15 1631 02/17/15 0806  WBC 9.0 11.6* 10.1  HGB 9.1* 9.2* 11.0*  HCT 26.6* 28.3* 32.2*  MCV 86.6 86.8 86.8  PLT 375 379 357   Cardiac Enzymes:  Recent Labs Lab 02/16/15 1631 02/16/15 2100 02/17/15 0806  TROPONINI 0.03 <0.03 <0.03   BNP: Invalid input(s): POCBNP CBG: No results for input(s): GLUCAP in the last 168 hours.  No results found for this or any previous visit (from the past 240 hour(s)).   Studies:              All Imaging reviewed and is as per above notation   Scheduled Meds: . enoxaparin (LOVENOX) injection  40 mg Subcutaneous Q24H  . hydrALAZINE  25 mg Oral 3 times per day  . methimazole  5 mg Oral Daily  . pneumococcal 23 valent vaccine  0.5 mL Intramuscular Tomorrow-1000  . sertraline  100 mg Oral Daily  . sodium chloride  3 mL Intravenous Q12H   Continuous Infusions: . 0.9 % NaCl with KCl 40 mEq / L 75 mL/hr (02/16/15 1816)     Assessment/Plan: Kathryn [name]   Multiple pelvic fractures -The patient's fractures are non-operative -Usually in these cases weight-bearing as tolerated to allow to heal -I have discussed the case with Dr. Dion [name] who after reviewing films  Recommend non op management with WBAT -Greatly appreciate input into care and will have outpatient follow-up  Spiculated RLL mass in former heavy smoker, quit 2014 -have discussed with daughter and grand-daughter -they will think about options and get back to [place] re: what they wanna do    Hypertension -Continue hydralazine 25 3 times a day    Hyperthyroidism -Continue Tapazole 5 mg daily -Consider outpatient TSH    Third degree AV block -[employer] rep has been consulted to interrogate the pacemaker to rule out cardiogenic cause for syncope -Patient tells me her legs just gave out -PPM interrogation is wnl  Possible humeral fracture -Patient moving right upper extremity properly and  eating -We will monitor    Acute hypovolemic hyponatremia -Unclear if this is related to spiculated mass in the chest -I will discuss further with family if they wish to have CT scan -Patient is relatively high functioning at baseline -We will attempt to hydrate her with saline and give her salt tablets or liberalize her diet to a regular diet    Hypokalemia -Replacing with IV saline and 40 mEq of K in the saline    Fall -Potentially secondary to orthostasis versus cardiogenic causes -Keep on telemetry overnight    Pressure ulcer -Monitor    Acute blood loss anemia -Transfused overnight? -Monitor CBC -Has a resolving hematoma from prior fall  Mitral valve calcification -Echocardiogram 2013 confirms this -EF at that time 55-60% without heart failure -PA peak pressure 67 mmHg  Hypomagnesemia Replaced in the emergency  room and on admission -Check magnesium in a.m.  Asymptomatic bacteriuria -Would not treat as clean catch   Appt with PCP: Requested Code Status: Full code Family Communication:Discussed with family long discussion on telephone granddaughter who understands  Disposition Plan: homemight require skilled nursing placement   Consultants: Discussed with Dr. Dion [name] of orthopedics as above  Pleas Koch, MD  [facility] Pager [phone] 02/17/2015, 9:21 AM    LOS: 1 day

## 2015-02-17 NOTE — Progress Notes (Addendum)
Spoke with Wynona Canes with customer service at [employer] and requested a pacemaker interrogation per MD request. She confirmed the patient was in their system, though she was listed under a different first name. We confirmed the serial number of the pacemaker and the pt's address to ensure that it was the correct pt in their system, and it was. She stated that she would put the request in to have someone come and interrogate the pacemaker. Will continue to monitor.  Magazine features editor Service number: [phone] Patient has a [employer], model 4135, pacemaker and the Serial number is [id number]; per the Op note from [place]. Ladona Ridgel, MD on 09/24/2011.  Arta Bruce [facility] 02/17/2015 7:54 AM

## 2015-02-17 NOTE — Progress Notes (Signed)
Pacemaker interrogated by [employer]. MD given the report and placed it on the chart.  Arta Bruce [facility] 02/17/2015

## 2015-02-18 DIAGNOSIS — S32810A Multiple fractures of pelvis with stable disruption of pelvic ring, initial encounter for closed fracture: Secondary | ICD-10-CM

## 2015-02-18 LAB — TYPE AND SCREEN
ABO/RH(D): B POS
Antibody Screen: NEGATIVE
UNIT DIVISION: 0

## 2015-02-18 LAB — CBC WITH DIFFERENTIAL/PLATELET
BASOS ABS: 0 10*3/uL (ref 0.0–0.1)
BASOS PCT: 0 % (ref 0–1)
EOS PCT: 1 % (ref 0–5)
Eosinophils Absolute: 0.1 10*3/uL (ref 0.0–0.7)
HCT: 29.6 % — ABNORMAL LOW (ref 36.0–46.0)
Hemoglobin: 10.2 g/dL — ABNORMAL LOW (ref 12.0–15.0)
Lymphocytes Relative: 9 % — ABNORMAL LOW (ref 12–46)
Lymphs Abs: 0.9 10*3/uL (ref 0.7–4.0)
MCH: 30.1 pg (ref 26.0–34.0)
MCHC: 34.5 g/dL (ref 30.0–36.0)
MCV: 87.3 fL (ref 78.0–100.0)
MONO ABS: 0.6 10*3/uL (ref 0.1–1.0)
Monocytes Relative: 6 % (ref 3–12)
Neutro Abs: 8.8 10*3/uL — ABNORMAL HIGH (ref 1.7–7.7)
Neutrophils Relative %: 84 % — ABNORMAL HIGH (ref 43–77)
PLATELETS: 340 10*3/uL (ref 150–400)
RBC: 3.39 MIL/uL — AB (ref 3.87–5.11)
RDW: 17.6 % — AB (ref 11.5–15.5)
WBC: 10.5 10*3/uL (ref 4.0–10.5)

## 2015-02-18 LAB — COMPREHENSIVE METABOLIC PANEL
ALBUMIN: 2.9 g/dL — AB (ref 3.5–5.0)
ALT: 13 U/L — AB (ref 14–54)
AST: 23 U/L (ref 15–41)
Alkaline Phosphatase: 70 U/L (ref 38–126)
Anion gap: 5 (ref 5–15)
BUN: 12 mg/dL (ref 6–20)
CHLORIDE: 103 mmol/L (ref 101–111)
CO2: 22 mmol/L (ref 22–32)
Calcium: 8.4 mg/dL — ABNORMAL LOW (ref 8.9–10.3)
Creatinine, Ser: 0.64 mg/dL (ref 0.44–1.00)
GFR calc Af Amer: >60 mL/min (ref >60)
GLUCOSE: 110 mg/dL — AB (ref 65–99)
POTASSIUM: 4.5 mmol/L (ref 3.5–5.1)
SODIUM: 130 mmol/L — AB (ref 135–145)
Total Bilirubin: 1.2 mg/dL (ref 0.3–1.2)
Total Protein: 5.3 g/dL — ABNORMAL LOW (ref 6.5–8.1)

## 2015-02-18 LAB — HEMOGLOBIN A1C
Hgb A1c MFr Bld: 5.6 % (ref 4.8–5.6)
MEAN PLASMA GLUCOSE: 114 mg/dL

## 2015-02-18 LAB — MAGNESIUM: Magnesium: 1.9 mg/dL (ref 1.7–2.4)

## 2015-02-18 LAB — PROTIME-INR
INR: 1.06 (ref 0.00–1.49)
PROTHROMBIN TIME: 14 s (ref 11.6–15.2)

## 2015-02-18 MED ORDER — DEXTROSE 5 % IV SOLN
1.0000 g | Freq: Every day | INTRAVENOUS | Status: DC
  Administered 2015-02-18 – 2015-02-21 (×4): 1 g via INTRAVENOUS
  Filled 2015-02-18 (×4): qty 10

## 2015-02-18 MED ORDER — SODIUM CHLORIDE 0.9 % IV SOLN
INTRAVENOUS | Status: AC
  Administered 2015-02-18: 21:00:00 via INTRAVENOUS

## 2015-02-18 NOTE — Consult Note (Signed)
WOC wound consult note Reason for Consult:Stage 2 pressure injury Wound type:pressure Pressure Ulcer POA: Yes Measurement:Area of blanchable erythema measures 6cm x 6cm with two partial thickness, Stage 2 pressure injuries on either side of coccyx, R>L and measures 2cm x 1.5cm x 0.2cm. Red, moist wound bed with basement cell membrane evident.  Wound bed:As described above. Drainage (amount, consistency, odor) Scant serous Periwound: erythematous, no induration, no warmth Dressing procedure/placement/frequency: I have provided guidance for nursing teams via the orders for turning and repositioning, floatation of bilateral heels and for use of a pressure redistribution chair pad when OOB in chair.  The area will be covered with a soft silicone foam dressing and area monitored for reepithelialization.  If you agree, please order a Nutritional Consult for encouragement of adequate protein in the wound healing process. Thank you for this consultation.  The WOC nursing team will not follow, but will remain available to this patient, the nursing and medical teams.  Please re-consult if needed. Thanks, Ladona Mow, MSN, RN, GNP, [place], CWON-AP [phone])

## 2015-02-18 NOTE — Progress Notes (Signed)
PT Cancellation Note  Patient Details Name: Kathryn [name] MRN: [id number] DOB: 04-07-1923   Cancelled Treatment:    Reason Eval/Treat Not Completed: Patient declined, no reason specified Pt encouraged to mobilize however refuses any movement at this time.  Pt states she is not going to move today.   Alfonso Shackett,[name] 02/18/2015, 9:39 AM Zenovia Jarred, PT, DPT 02/18/2015 Pager: [phone]

## 2015-02-18 NOTE — Progress Notes (Signed)
Kathryn [name] XLK:[id number] DOB: May 02, 1923 DOA: 02/16/2015 PCP: Sissy Hoff, MD  Brief narrative: 81 y/o ? 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  Ration initially presented to the emergency room on 8/12 and had a fall she got startled and had a mechanical fall Usually at baseline she uses a cane and is supposed to use a walker but is not doing that She had hit her head and a CT scan head and C-spine did not show any abnormalities she was intact and x-ray did not show any bony fractures or dislocations at the time Represented to the emergency room because of inability to ambulate and was found to have multiple pelvic insufficiency fractures  Past medical history-As per Problem list Chart reviewed as below-   Consultants:  Orthopedics  Procedures: CT 1. Comminuted fractures involving the superior and inferior pubic rami bilaterally, as described above, with extension of fracture lines to the pubis symphysis on both sides. Associated resolving hematoma just superior to the pubic symphysis, measuring 5.6 x 2.7 x 1.5 cm. Ossification within the hematoma is thought to reflect callus formation. 2. Mildly comminuted fracture involving the right sacral ala, extending to the right sacroiliac joint, without evidence of joint space widening. 3. Diffuse calcification along the abdominal aorta and its branches, including along the superior mesenteric artery and proximal inferior mesenteric artery. Likely severe luminal narrowing noted at the distal abdominal aorta and along the common iliac arteries and external iliac arteries bilaterally. 4. Diffuse calcification along the left renal artery, with marked left renal  atrophy. 5. Trace bilateral pleural effusions, with bibasilar atelectasis, left greater than right. 6. Dense calcification at the mitral valve. 7. Small to moderate hiatal hernia noted. 8. Mild diverticulosis along the sigmoid colon, without evidence of  diverticulitis.  Antibiotics:     Subjective   Upset she didn't get to eat yesterday Good appetite at bedside this am-Tolerating diet well  Pain is moderately controlled No chest pain, no diarrhea, no blurred vision, no double vision   Objective    Interim History:   Telemetry: Paced r, PVC   Objective: Filed Vitals:   02/17/15 1452 02/17/15 2049 02/18/15 0609 02/18/15 1400  BP: 150/54 130/84 141/96 127/53  Pulse: 82 71 81 68  Temp: 98.2 F (36.8 C) 98.2 F (36.8 C) 98.4 F (36.9 C) 99.2 F (37.3 C)  TempSrc: Oral Oral Oral Oral  Resp: 18 18 18 16   Height:      Weight:   107 lb 12.9 oz (48.9 kg)   SpO2: 95% 98% 94% 97%    Intake/Output Summary (Last 24 hours) at 02/18/15 1906 Last data filed at 02/18/15 1839  Gross per 24 hour  Intake   2015 ml  Output    275 ml  Net   1740 ml    Exam:  General: eomi, ncat edentulous, no pallor nor ict, pleasant alert x 4 Cardiovascular: S1-S2 paced rhythm Respiratory: Clinically clear no added sound significant kyphosis Abdomen: Soft nontender nondistended no rebound Skin no lower extremity edema Neuro grossly intact MSK-pain on flexion of knee to chest, radiating down to the back of the knee  Data Reviewed: Basic Metabolic Panel:  Recent Labs Lab 02/16/15 1326 02/16/15 1631 02/17/15 0806 02/18/15 0507  NA 128*  --  129* 130*  K 3.1*  --  3.7 4.5  CL 93*  --  97* 103  CO2 28  --  25 22  GLUCOSE 127*  --  112* 110*  BUN 18  --  15 12  CREATININE 0.75 0.68 0.73 0.64  CALCIUM 9.0  --  8.5* 8.4*  MG  --  1.5*  --  1.9   Liver Function Tests:  Recent Labs Lab 02/17/15 0806 02/18/15 0507  AST 28 23  ALT 15 13*  ALKPHOS 70 70  BILITOT 1.9* 1.2   PROT 5.8* 5.3*  ALBUMIN 3.3* 2.9*   No results for input(s): LIPASE, AMYLASE in the last 168 hours. No results for input(s): AMMONIA in the last 168 hours. CBC:  Recent Labs Lab 02/16/15 1326 02/16/15 1631 02/17/15 0806 02/18/15 0507  WBC 9.0 11.6* 10.1 10.5  NEUTROABS  --   --   --  8.8*  HGB 9.1* 9.2* 11.0* 10.2*  HCT 26.6* 28.3* 32.2* 29.6*  MCV 86.6 86.8 86.8 87.3  PLT 375 379 357 340   Cardiac Enzymes:  Recent Labs Lab 02/16/15 1631 02/16/15 2100 02/17/15 0806  TROPONINI 0.03 <0.03 <0.03   BNP: Invalid input(s): POCBNP CBG: No results for input(s): GLUCAP in the last 168 hours.  Recent Results (from the past 240 hour(s))  Urine culture     Status: None (Preliminary result)   Collection Time: 02/17/15  8:00 AM  Result Value Ref Range Status   Specimen Description URINE, CLEAN CATCH  Final   Special Requests NONE  Final   Culture   Final    >=100,000 COLONIES/mL GRAM NEGATIVE RODS Performed at [facility]    Report Status PENDING  Incomplete     Studies:              All Imaging reviewed and is as per above notation   Scheduled Meds: . amLODipine  5 mg Oral Daily  . calcium-vitamin D  2 tablet Oral Daily  . cefTRIAXone (ROCEPHIN)  IV  1 g Intravenous Daily  . enoxaparin (LOVENOX) injection  40 mg Subcutaneous Q24H  . hydrALAZINE  25 mg Oral 3 times per day  . lisinopril  40 mg Oral Daily  . methimazole  5 mg Oral Daily  . pneumococcal 23 valent vaccine  0.5 mL Intramuscular Tomorrow-1000  . sertraline  100 mg Oral Daily  . simvastatin  20 mg Oral QPM  . sodium chloride  3 mL Intravenous Q12H   Continuous Infusions: . 0.9 % NaCl with KCl 40 mEq / L 75 mL/hr (02/18/15 0510)     Assessment/Plan: [name]   Multiple pelvic fractures -The patient's fractures are non-operative -Usually in these cases weight-bearing as tolerated to allow to heal -I have discussed the case with Dr. Dion Saucier who after reviewing films  Recommend non op  management with WBAT -Greatly appreciate input into care and will have outpatient follow-up  Spiculated RLL mass on cxr - former heavy smoker, quit 2014 -have discussed with daughter and grand-daughter -Ct chest no mass.    Hypertension -Continue hydralazine 25 3 times a day    Hyperthyroidism -Continue Tapazole 5 mg daily -Consider outpatient TSH    Third degree AV block -[employer] rep has been consulted to interrogate the pacemaker to rule out cardiogenic cause for syncope -Patient tells me her legs just gave out -PPM interrogation is wnl  Possible humeral fracture -Patient moving right upper extremity properly and eating -We will monitor  Acute hypovolemic hyponatremia -Unclear if this is related to spiculated mass in the chest -I will discuss further with family if they wish to have CT scan -Patient is relatively high functioning at baseline -We will attempt to hydrate her with saline and give her salt tablets or liberalize her diet to a regular diet -improving with hydration    Hypokalemia -Replacing with IV saline and 40 mEq of K in the saline -k normalized, change to ns without k    Fall -Potentially secondary to orthostasis versus cardiogenic causes -paced rhythm, on telemetry     Pressure ulcer -Monitor    Acute blood loss anemia -s/p prbc x1 on 8/20 -hgb improved -Has a resolving hematoma from prior fall  Mitral valve calcification -Echocardiogram 2013 confirms this -EF at that time 55-60% without heart failure -PA peak pressure 67 mmHg  Hypomagnesemia Replaced in the emergency room and on admission -improved  Asymptomatic bacteriuria -Would not treat as clean catch   Appt with PCP: Requested Code Status: Full code Family Communication:Discussed with family long discussion on telephone granddaughter by Dr. Mahala Menghini Disposition Plan: likely skilled nursing placement   Consultants: Discussed with Dr. Dion Saucier of orthopedics as  above  Pleas Koch, MD  [facility] Pager [phone] 02/18/2015, 7:06 PM    LOS: 2 days

## 2015-02-18 NOTE — Clinical Documentation Improvement (Signed)
Hospitalist Orthopedic  Can the diagnosis of Fracture be further specified?   Pathological fracture secondary to Osteoporosis  Not a pathological fracture  Other  Clinically Undetermined  Document any associated diagnoses/conditions.  Supporting Information:   "Films reviewed, appears like a stable osteoporotic pelvic ring fracture, not visible on plain films, only on CT" found in 8/21 progress note.  Being treated with Oscal with D    2 tabs daily  Please exercise your independent, professional judgment when responding. A specific answer is not anticipated or expected.  Thank You,  Shellee Milo Health Information Management [place] [phone]

## 2015-02-19 NOTE — Evaluation (Signed)
Physical Therapy Evaluation Patient Details Name: Kathryn [name] MRN: [id number] DOB: 09/22/1922 Today's Date: 02/19/2015   History of Present Illness  81 year old female with a history of hypertension, depression, prior history of right intertrochanteric fracture in 2013, third-degree AV block with a pacemaker, chronic chest pain at the pacemaker site admitted after fall at home on 8/12 and found to have stable osteoporotic pelvic ring fracture.  Ortho states okay for WBAT and nonoperative management of pelvic fractures.  Clinical Impression  Pt admitted with above diagnosis. Pt currently with functional limitations due to the deficits listed below (see PT Problem List).  Pt will benefit from skilled PT to increase their independence and safety with mobility to allow discharge to the venue listed below.   Pt currently min assist for transfer at this time and requiring assist for pericare.  Recommend SNF upon d/c.     Follow Up Recommendations SNF    Equipment Recommendations  Rolling walker with 5" wheels    Recommendations for Other Services       Precautions / Restrictions Precautions Precautions: Fall Precaution Comments: urinary incontinence      Mobility  Bed Mobility Overal bed mobility: Needs Assistance Bed Mobility: Supine to Sit     Supine to sit: Min assist;HOB elevated     General bed mobility comments: verbal cues for step by step technique as pt would initiate and then stop movement, denies pain  Transfers Overall transfer level: Needs assistance Equipment used: Rolling walker (2 wheeled) Transfers: Sit to/from [employer] Sit to Stand: Min assist Stand pivot transfers: Min assist       General transfer comment: assist to rise and steady as well as control descent, urinary incontinence with standing, RN bought BSC to pt, required assist for pericare in standing and then assisted back with taking a few steps backwards to  recliner  Ambulation/Gait                Stairs            Wheelchair Mobility    Modified Rankin (Stroke Patients Only)       Balance Overall balance assessment: History of Falls;Needs assistance         Standing balance support: Bilateral upper extremity supported;During functional activity Standing balance-Leahy Scale: Poor Standing balance comment: requires RW for stability, also unable to perform pericare requiring assist in standing                             Pertinent Vitals/Pain Pain Assessment: 0-10 Pain Score: 10-Worst pain ever Pain Location: pelvis Pain Descriptors / Indicators: Aching;Sore Pain Intervention(s): Limited activity within patient's tolerance;Monitored during session;Repositioned (did not appear in 10/10 pain during movement, states no pain at rest)    Home Living Family/patient expects to be discharged to:: Skilled nursing facility Living Arrangements: Alone                    Prior Function Level of Independence: Independent with assistive device(s)         Comments: reports using SPC prior to admission     Hand Dominance        Extremity/Trunk Assessment               Lower Extremity Assessment: Generalized weakness         Communication   Communication: Other (comment) (macular degeneration, vision impairment)  Cognition Arousal/Alertness: Awake/alert Behavior During Therapy: [facility] for tasks  assessed/performed Overall Cognitive Status: Within Functional Limits for tasks assessed                      General Comments      Exercises        Assessment/Plan    PT Assessment Patient needs continued PT services  PT Diagnosis Difficulty walking;Generalized weakness;Acute pain   PT Problem List Decreased strength;Decreased activity tolerance;Decreased mobility;Decreased balance;Decreased knowledge of use of DME;Pain  PT Treatment Interventions DME instruction;Gait  training;Functional mobility training;Patient/family education;Therapeutic activities;Therapeutic exercise   PT Goals (Current goals can be found in the Care Plan section) Acute Rehab PT Goals PT Goal Formulation: With patient Time For Goal Achievement: 02/26/15 Potential to Achieve Goals: Good    Frequency Min 3X/week   Barriers to discharge        Co-evaluation               End of Session Equipment Utilized During Treatment: Gait belt Activity Tolerance: Patient limited by fatigue;Patient limited by pain Patient left: in chair;with call bell/phone within reach;with chair alarm set Nurse Communication: Mobility status (in room for [facility] transfer)         Time: 1029-1050 PT Time Calculation (min) (ACUTE ONLY): 21 min   Charges:   PT Evaluation $Initial PT Evaluation Tier I: 1 Procedure     PT G Codes:        Izick Gasbarro,[name] 02/19/2015, 12:57 PM Zenovia Jarred, PT, DPT 02/19/2015 Pager: [phone]

## 2015-02-19 NOTE — Progress Notes (Signed)
CSW continuing to follow.   CSW followed up with pt granddaughter, [name] via telephone and provided pt granddaughter with SNF bed offers that are available at this time.   CSW to follow up with pt and pt granddaughter tomorrow to provide any further bed offers and inquire about decision for SNF.   CSW to continue to follow to provide support and assist with pt discharge planning needs.   Loletta Specter, MSW, LCSW Clinical Social Work [phone]

## 2015-02-19 NOTE — Care Management Note (Signed)
Case Management Note  Patient Details  Name: Kathryn [name] MRN: [id number] Date of Birth: [birth year]  Subjective/Objective:             81 yo admitted with multiple pelvic fractures       Action/Plan: From home alone  Expected Discharge Date:  02/18/15               Expected Discharge Plan:  Skilled Nursing Facility  In-House Referral:  Clinical Social Work  Discharge planning Services  CM Consult  Post Acute Care Choice:    Choice offered to:     DME Arranged:    DME Agency:     HH Arranged:    HH Agency:     Status of Service:  In process, will continue to follow  Medicare Important Message Given:  Yes-second notification given Date Medicare IM Given:    Medicare IM give by:    Date Additional Medicare IM Given:    Additional Medicare Important Message give by:     If discussed at Long Length of Stay Meetings, dates discussed:    Additional Comments: Chart reviewed. Pt for SNF at DC. CM will continue to follow. Bartholome Bill, RN 02/19/2015, 3:01 PM

## 2015-02-19 NOTE — Clinical Social Work Placement (Signed)
   CLINICAL SOCIAL WORK PLACEMENT  NOTE  Date:  02/19/2015  Patient Details  Name: Kathryn [name] MRN: [id number] Date of Birth: 12-29-1922  Clinical Social Work is seeking post-discharge placement for this patient at the Skilled  Nursing Facility level of care (*CSW will initial, date and re-position this form in  chart as items are completed):  Yes   Patient/family provided with [place] Clinical Social Work Department's list of facilities offering this level of care within the geographic area requested by the patient (or if unable, by the patient's family).  Yes   Patient/family informed of their freedom to choose among providers that offer the needed level of care, that participate in Medicare, Medicaid or managed care program needed by the patient, have an available bed and are willing to accept the patient.  Yes   Patient/family informed of [place]'s ownership interest in [facility] and [facility], as well as of the fact that they are under no obligation to receive care at these facilities.  PASRR submitted to EDS on       PASRR number received on       Existing PASRR number confirmed on 02/19/15     FL2 transmitted to all facilities in geographic area requested by pt/family on 02/19/15     FL2 transmitted to all facilities within larger geographic area on 02/19/15     Patient informed that his/her managed care company has contracts with or will negotiate with certain facilities, including the following:            Patient/family informed of bed offers received.  Patient chooses bed at       Physician recommends and patient chooses bed at      Patient to be transferred to   on  .  Patient to be transferred to facility by       Patient family notified on   of transfer.  Name of family member notified:        PHYSICIAN Please sign FL2     Additional Comment:    _______________________________________________ Orson Eva, LCSW 02/19/2015,  2:39 PM

## 2015-02-19 NOTE — Clinical Social Work Note (Signed)
Clinical Social Work Assessment  Patient Details  Name: Kathryn [name] MRN: [id number] Date of Birth: 01/18/1923  Date of referral:  02/19/15               Reason for consult:  Discharge Planning                Permission sought to share information with:  Family Supports Permission granted to share information::  Yes, Verbal Permission Granted  Name::     Jennifer Pitts  Agency::     Relationship::  granddaughter  Contact Information:  [phone]  Housing/Transportation Living arrangements for the past 2 months:  Single Family Home Source of Information:  Patient, Other (Comment Required) (granddaughter) Patient Interpreter Needed:  None Criminal Activity/Legal Involvement Pertinent to Current Situation/Hospitalization:  No - Comment as needed Significant Relationships:  Other Family Members Lives with:  Self Do you feel safe going back to the place where you live?  No Need for family participation in patient care:  Yes (Comment)  Care giving concerns:  Pt admitted from home alone. PT evaluated pt and recommended SNF. Pt recognizes that she is not safe to return home alone at this time.   Social Worker assessment / plan:  CSW received referral for New SNF.   CSW met with pt at bedside. CSW introduced self and explained role. Pt confirmed that she lives at home alone. CSW discussed PT recommendation for rehab at SNF and pt agreeable. CSW provided support as pt discussed that she recognizes that she needs more assistance than she can manage alone at home. CSW discussed process of SNF search and pt agreeable to [place] SNF search. CSW inquired with pt if pt has family member that can assist her with making decisions and pt stated there her granddaughter, Jennifer assist her with her decisions. Pt provide permission for CSW to contact pt granddaughter.   CSW contacted pt granddaughter, Jennifer via telephone and pt granddaughter stated that she was entering hospital and  would like to meet with CSW at bedside. CSW met with pt granddaughter at bedside and updated pt granddaughter on conversation with pt. Pt granddaughter grateful that pt agreeable to rehab at SNF. Pt granddaughter willing to assist pt in making decision. CSW provided pt granddaughter list of SNF facilities in order for pt granddaughter to have when CSW contacts pt granddaughter with bed offers.  CSW completed FL2 and initiated SNF search to [place].   CSW to follow up with pt and pt granddaughter with SNF bed offers.  CSW to continue to follow to provide support and assist with pt discharge planning needs.   Employment status:  Retired Insurance information:  Managed Medicare PT Recommendations:  Skilled Nursing Facility Information / Referral to community resources:  Skilled Nursing Facility  Patient/Family's Response to care:  Pt alert and oriented x 4. Pt actively involved in conversation. Pt agreeable to SNF placement for short term rehab and agreeable to pt granddaughter assisting with decision.   Patient/Family's Understanding of and Emotional Response to Diagnosis, Current Treatment, and Prognosis:  Pt granddaughter reported that she received update from the nurse, but was still unsure about anticipated discharge date. MD had not yet rounded on pt at that time in order for CSW to review note to determine anticipated discharge date.   Emotional Assessment Appearance:  Appears stated age Attitude/Demeanor/Rapport:  Other (pt appropriate) Affect (typically observed):  Accepting Orientation:  Oriented to Self, Oriented to Place, Oriented to  Time, Oriented to Situation   Alcohol / Substance use:  Not Applicable Psych involvement (Current and /or in the community):  No (Comment)  Discharge Needs  Concerns to be addressed:  Discharge Planning Concerns Readmission within the last 30 days:  No Current discharge risk:  Lives alone Barriers to Discharge:  Continued Medical Work  up   Ladell Pier, [place] 02/19/2015, 2:30 PM  [phone]

## 2015-02-19 NOTE — Care Management Important Message (Signed)
Important Message  Patient DetaiJESSICCA STITZER G O'Sullivan MRN: [id number] Date of Birth: 04/13/1923   Medicare Important Message Given:  Yes-second notification given    Haskell Flirt 02/19/2015, 12:19 PMImportant Message  Patient Details  Name: [name] MRN: [id number] Date of Birth: 1922/12/18   Medicare Important Message Given:  Yes-second notification given    Haskell Flirt 02/19/2015, 12:19 PM

## 2015-02-19 NOTE — Progress Notes (Signed)
Kathryn [name] YQM:[id number] DOB: 1922-07-07 DOA: 02/16/2015 PCP: Sissy Hoff, MD  Brief narrative: 81 y/o ? 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 degen  Ration initially presented to the emergency room on 8/12 and had a fall she got startled and had a mechanical fall Usually at baseline she uses a cane and is supposed to use a walker but is not doing that She had hit her head and a CT scan head and C-spine did not show any abnormalities she was intact and x-ray did not show any bony fractures or dislocations at the time Represented to the emergency room because of inability to ambulate and was found to have multiple pelvic insufficiency fractures  Past medical history-As per Problem list Chart reviewed as below-   Consultants:  Orthopedics  Procedures: CT 1. Comminuted fractures involving the superior and inferior pubic rami bilaterally, as described above, with extension of fracture lines to the pubis symphysis on both sides. Associated resolving hematoma just superior to the pubic symphysis, measuring 5.6 x 2.7 x 1.5 cm. Ossification within the hematoma is thought to reflect callus formation. 2. Mildly comminuted fracture involving the right sacral ala, extending to the right sacroiliac joint, without evidence of joint space widening. 3. Diffuse calcification along the abdominal aorta and its branches, including along the superior mesenteric artery and proximal inferior mesenteric artery. Likely severe luminal narrowing noted at the distal abdominal aorta and along the common iliac arteries and external iliac arteries bilaterally. 4. Diffuse calcification along the left renal artery, with marked left renal  atrophy. 5. Trace bilateral pleural effusions, with bibasilar atelectasis, left greater than right. 6. Dense calcification at the mitral valve. 7. Small to moderate hiatal hernia noted. 8. Mild diverticulosis along the sigmoid colon, without evidence of  diverticulitis.  Antibiotics:  Rocephin from 8/22   Subjective   Feeling much better today, sitting in chair, much alert, reported less pain No chest pain, no diarrhea, baseline poor vision due to macular degeneration   Objective    Telemetry: Paced r, PVC   Objective: Filed Vitals:   02/18/15 2007 02/19/15 0610 02/19/15 1329 02/19/15 1440  BP: 128/55 142/53 118/44 116/62  Pulse: 76 69 61   Temp: 98.3 F (36.8 C) 98.2 F (36.8 C) 98.1 F (36.7 C)   TempSrc: Oral Oral Oral   Resp: Height:      Weight:      SpO2: 94% 94% 95%     Intake/Output Summary (Last 24 hours) at 02/19/15 1504 Last data filed at 02/19/15 1328  Gross per 24 hour  Intake 1482.5 ml  Output      0 ml  Net 1482.5 ml    Exam:  General: much more alert today, not oriented to time, but to place and person Cardiovascular: S1-S2 paced rhythm Respiratory: Clinically clear no added sound significant kyphosis Abdomen: Soft nontender nondistended no rebound Skin: no lower extremity edema Neuro; not oriented to time but to place and person MSK-pain on flexion of knee to chest, radiating down to the back of the knee  Data Reviewed: Basic Metabolic Panel:  Recent Labs Lab 02/16/15 1326 02/16/15 1631 02/17/15 0806 02/18/15 0507  NA 128*  --  129* 130*  K 3.1*  --  3.7  4.5  CL 93*  --  97* 103  CO2 28  --  25 22  GLUCOSE 127*  --  112* 110*  BUN 18  --  15 12  CREATININE 0.75 0.68 0.73 0.64  CALCIUM 9.0  --  8.5* 8.4*  MG  --  1.5*  --  1.9   Liver Function Tests:  Recent Labs Lab 02/17/15 0806 02/18/15 0507  AST 28 23  ALT 15 13*  ALKPHOS 70 70  BILITOT 1.9* 1.2  PROT 5.8* 5.3*  ALBUMIN 3.3* 2.9*   No results for  input(s): LIPASE, AMYLASE in the last 168 hours. No results for input(s): AMMONIA in the last 168 hours. CBC:  Recent Labs Lab 02/16/15 1326 02/16/15 1631 02/17/15 0806 02/18/15 0507  WBC 9.0 11.6* 10.1 10.5  NEUTROABS  --   --   --  8.8*  HGB 9.1* 9.2* 11.0* 10.2*  HCT 26.6* 28.3* 32.2* 29.6*  MCV 86.6 86.8 86.8 87.3  PLT 375 379 357 340   Cardiac Enzymes:  Recent Labs Lab 02/16/15 1631 02/16/15 2100 02/17/15 0806  TROPONINI 0.03 <0.03 <0.03   BNP: Invalid input(s): POCBNP CBG: No results for input(s): GLUCAP in the last 168 hours.  Recent Results (from the past 240 hour(s))  Urine culture     Status: None (Preliminary result)   Collection Time: 02/17/15  8:00 AM  Result Value Ref Range Status   Specimen Description URINE, CLEAN CATCH  Final   Special Requests NONE  Final   Culture   Final    >=100,000 COLONIES/mL GRAM NEGATIVE RODS Performed at [facility]    Report Status PENDING  Incomplete     Studies:              All Imaging reviewed and is as per above notation   Scheduled Meds: . amLODipine  5 mg Oral Daily  . calcium-vitamin D  2 tablet Oral Daily  . cefTRIAXone (ROCEPHIN)  IV  1 g Intravenous Daily  . enoxaparin (LOVENOX) injection  40 mg Subcutaneous Q24H  . hydrALAZINE  25 mg Oral 3 times per day  . lisinopril  40 mg Oral Daily  . methimazole  5 mg Oral Daily  . pneumococcal 23 valent vaccine  0.5 mL Intramuscular Tomorrow-1000  . sertraline  100 mg Oral Daily  . simvastatin  20 mg Oral QPM  . sodium chloride  3 mL Intravenous Q12H   Continuous Infusions: . sodium chloride 75 mL/hr at 02/18/15 2038     Assessment/Plan:    Multiple pelvic fractures -The patient's fractures are non-operative -Usually in these cases weight-bearing as tolerated to allow to heal -I have discussed the case with Dr. Dion Saucier who after reviewing films  Recommend non op management with [facility] -Greatly appreciate input into care and will have outpatient  follow-up  UTI: g-rod in urine, has mild leukocytosis on presentation, did report n/v, weakness, patient not able to provide reliable history in terms on urinary urinary symptom, family reported acute onset of confusion, started rocephin, abx adjustment per final urine culture.  Fall -likely from UTI. -paced rhythm, on telemetry    Acute blood loss anemia -s/p prbc x1 on 8/20 -hgb improved -Has a resolving hematoma from prior fall  Possible humeral fracture -Patient moving right upper extremity properly and eating -We will monitor    Acute hypovolemic hyponatremia -improving with hydration    Hypokalemia -Replacing with IV saline and 40 mEq of K in the saline -k normalized, change to  ns without k   Hypomagnesemia Replaced in the emergency room and on admission -improved     Pressure ulcer -Monitor      Hypertension -Continue norvasc, lisinopril, hydralazine 25 3 times a day    Hyperthyroidism -Continue Tapazole 5 mg daily - TSH wnl    Third degree AV block -[employer] rep has been consulted to interrogate the pacemaker to rule out cardiogenic cause for syncope -Patient tells me her legs just gave out -PPM interrogation is wnl   Mitral valve calcification -Echocardiogram 2013 confirms this -EF at that time 55-60% without heart failure -PA peak pressure 67 mmHg   Spiculated RLL mass on cxr - former heavy smoker, quit 2014 -have discussed with daughter and grand-daughter -Ct chest no mass.     Appt with PCP: Requested Code Status: DNR, verified with patient and her granddaughter Family Communication: discussion on telephone with granddaughter [name] Disposition Plan: skilled nursing placement   Consultants: Discussed with Dr. Dion Saucier of orthopedics as above Disposition: to SNF when urine culture finalized, likely 8/24.  Albertine Grates, MD PhD [facility] Pager [phone] 02/19/2015, 3:04 PM    LOS: 3 days

## 2015-02-19 NOTE — Progress Notes (Signed)
Patient wanted staff to telephone [name], the grand daughter. Patient told her grand daughter that " something was wrong and it begun at supper time".

## 2015-02-20 ENCOUNTER — Inpatient Hospital Stay (HOSPITAL_COMMUNITY): Payer: Medicare Other

## 2015-02-20 DIAGNOSIS — E871 Hypo-osmolality and hyponatremia: Secondary | ICD-10-CM

## 2015-02-20 DIAGNOSIS — I10 Essential (primary) hypertension: Secondary | ICD-10-CM

## 2015-02-20 DIAGNOSIS — D62 Acute posthemorrhagic anemia: Secondary | ICD-10-CM

## 2015-02-20 DIAGNOSIS — S32810D Multiple fractures of pelvis with stable disruption of pelvic ring, subsequent encounter for fracture with routine healing: Secondary | ICD-10-CM

## 2015-02-20 LAB — BASIC METABOLIC PANEL
ANION GAP: 3 — AB (ref 5–15)
BUN: 11 mg/dL (ref 6–20)
CO2: 23 mmol/L (ref 22–32)
Calcium: 8.6 mg/dL — ABNORMAL LOW (ref 8.9–10.3)
Chloride: 101 mmol/L (ref 101–111)
Creatinine, Ser: 0.63 mg/dL (ref 0.44–1.00)
GFR calc Af Amer: >60 mL/min (ref >60)
Glucose, Bld: 88 mg/dL (ref 65–99)
POTASSIUM: 4.3 mmol/L (ref 3.5–5.1)
SODIUM: 127 mmol/L — AB (ref 135–145)

## 2015-02-20 LAB — CBC
HCT: 28.8 % — ABNORMAL LOW (ref 36.0–46.0)
Hemoglobin: 9.3 g/dL — ABNORMAL LOW (ref 12.0–15.0)
MCH: 28.8 pg (ref 26.0–34.0)
MCHC: 32.3 g/dL (ref 30.0–36.0)
MCV: 89.2 fL (ref 78.0–100.0)
Platelets: 321 10*3/uL (ref 150–400)
RBC: 3.23 MIL/uL — ABNORMAL LOW (ref 3.87–5.11)
RDW: 17.5 % — ABNORMAL HIGH (ref 11.5–15.5)
WBC: 9 10*3/uL (ref 4.0–10.5)

## 2015-02-20 LAB — MAGNESIUM: Magnesium: 1.5 mg/dL — ABNORMAL LOW (ref 1.7–2.4)

## 2015-02-20 MED ORDER — BISACODYL 10 MG RE SUPP
10.0000 mg | Freq: Every day | RECTAL | Status: DC | PRN
  Administered 2015-02-21: 10 mg via RECTAL
  Filled 2015-02-20: qty 1

## 2015-02-20 MED ORDER — SENNA 8.6 MG PO TABS
2.0000 | ORAL_TABLET | Freq: Every day | ORAL | Status: DC
  Administered 2015-02-20: 17.2 mg via ORAL
  Filled 2015-02-20 (×2): qty 2

## 2015-02-20 NOTE — Clinical Social Work Placement (Signed)
   CLINICAL SOCIAL WORK PLACEMENT  NOTE  Date:  02/20/2015  Patient Details  Name: Kathryn [name] MRN: [id number] Date of Birth: 1922-07-29  Clinical Social Work is seeking post-discharge placement for this patient at the Skilled  Nursing Facility level of care (*CSW will initial, date and re-position this form in  chart as items are completed):  Yes   Patient/family provided with [place] Clinical Social Work Department's list of facilities offering this level of care within the geographic area requested by the patient (or if unable, by the patient's family).  Yes   Patient/family informed of their freedom to choose among providers that offer the needed level of care, that participate in Medicare, Medicaid or managed care program needed by the patient, have an available bed and are willing to accept the patient.  Yes   Patient/family informed of [facility]'s ownership interest in [facility] and [facility], as well as of the fact that they are under no obligation to receive care at these facilities.  PASRR submitted to EDS on       PASRR number received on       Existing PASRR number confirmed on 02/19/15     FL2 transmitted to all facilities in geographic area requested by pt/family on 02/19/15     FL2 transmitted to all facilities within larger geographic area on 02/19/15     Patient informed that his/her managed care company has contracts with or will negotiate with certain facilities, including the following:        Yes   Patient/family informed of bed offers received.  Patient chooses bed at [facility]     Physician recommends and patient chooses bed at      Patient to be transferred to   on  .  Patient to be transferred to facility by       Patient family notified on   of transfer.  Name of family member notified:        PHYSICIAN Please sign FL2     Additional Comment:    _______________________________________________ Orson Eva, LCSW 02/20/2015, 2:09 PM

## 2015-02-20 NOTE — Progress Notes (Addendum)
CSW continuing to follow.   CSW met with pt at bedside. CSW provided support as pt discussed that she is "hurting all over". Pt confirmed that she had notified the nurse. CSW provided pt with SNF bed offers. Pt stated, "I can't see and my granddaughter, Anderson Malta will help me decide." CSW discussed that CSW is keeping pt granddaughter, Anderson Malta updated with bed offers in order for pt granddaughter to assist with decision.   CSW contacted pt granddaughter, Anderson Malta via telephone and left voice message. CSW to await return phone call from pt granddaughter to provide further bed offers.  CSW to continue to follow to provide support and assist with pt disposition needs.   Addendum 11:50 am:  CSW received return phone call from pt granddaughter, Anderson Malta. CSW updated pt granddaughter on SNF bed offers. Pt granddaughter stated that she wanted to discuss with pt daughter and will contact CSW back with decision about SNF.   CSW to continue to follow.  Alison Murray, MSW, [place] Work [phone]

## 2015-02-20 NOTE — Clinical Social Work Placement (Signed)
   CLINICAL SOCIAL WORK PLACEMENT  NOTE  Date:  02/20/2015  Patient Details  Name: Kathryn [name] MRN: [id number] Date of Birth: 1923-05-30  Clinical Social Work is seeking post-discharge placement for this patient at the Skilled  Nursing Facility level of care (*CSW will initial, date and re-position this form in  chart as items are completed):  Yes   Patient/family provided with [place] Clinical Social Work Department's list of facilities offering this level of care within the geographic area requested by the patient (or if unable, by the patient's family).  Yes   Patient/family informed of their freedom to choose among providers that offer the needed level of care, that participate in Medicare, Medicaid or managed care program needed by the patient, have an available bed and are willing to accept the patient.  Yes   Patient/family informed of 's ownership interest in [facility] and [facility], as well as of the fact that they are under no obligation to receive care at these facilities.  PASRR submitted to EDS on       PASRR number received on       Existing PASRR number confirmed on 02/19/15     FL2 transmitted to all facilities in geographic area requested by pt/family on 02/19/15     FL2 transmitted to all facilities within larger geographic area on 02/19/15     Patient informed that his/her managed care company has contracts with or will negotiate with certain facilities, including the following:        Yes   Patient/family informed of bed offers received.  Patient chooses bed at       Physician recommends and patient chooses bed at      Patient to be transferred to   on  .  Patient to be transferred to facility by       Patient family notified on   of transfer.  Name of family member notified:        PHYSICIAN Please sign FL2     Additional Comment:    _______________________________________________ Orson Eva,  LCSW 02/20/2015, 10:46 AM

## 2015-02-20 NOTE — Progress Notes (Signed)
PROGRESS NOTE    Kathryn [name] HYQ:[id number] DOB: 03/17/1923 DOA: 02/16/2015 PCP: Sissy Hoff, MD  HPI/Brief narrative 81 year old female with history of HTN, depression, dementia, right intertrochanteric fracture 2013, third-degree AV block status post PPM, chronic chest pain at pacemaker site, fell 8/12-CT head and C-spine, x-ray left elbow without abnormality, admitted to [facility] on 02/16/15 with complaints of difficulty ambulating since 8/12 and a large bruise on her left posterior thigh, and difficulty ambulating.   Assessment/Plan:  Multiple pelvic fractures - Dr. Albertine Grates discussed patient's case with Dr. Dion Saucier who reviewed imaging studies and recommended nonoperative management with WBAT - Initial x-ray suggested possible right humeral fracture. Repeated x-ray 8/24: No fractures.  Gram-negative rod UTI - Patient had mild leukocytosis on admission. History not reliable. Started on IV Rocephin pending urine culture results (discussed with microbiology lab)  Mechanical fall - On 8/12. SNF at discharge.  Acute blood loss anemia - Status post 1 unit PRBC on 8/20. Stable.  Hyponatremia - Seems chronic and intermittent. Also seems asymptomatic. Follow BMP in a.m. Etiology unclear.  Hypokalemia/hypomagnesemia - Replace as needed.  Pressure ulcer - Monitor.  Essential hypertension - Controlled on amlodipine, lisinopril and hydralazine  Hyperthyroid - TSH normal. Continue Tapazole  Complete heart block status post PPM  Spiculated RLL mass on chest x-ray - CT chest shows no mass.    DVT prophylaxis: Lovenox Code Status: DO NOT RESUSCITATE Family Communication: None at bedside. Disposition Plan: DC to SNF, possibly 8/25   Consultants:  None  Procedures:  None.  Antibiotics:  IV Rocephin   Subjective: Denies complaints. Hip pain improved. As per nursing, able to sit up in chair yesterday.  Objective: Filed Vitals:   02/19/15  1440 02/19/15 2316 02/20/15 0428 02/20/15 1318  BP: 116/62 132/40 154/48 149/58  Pulse:  63 65 68  Temp:  98.4 F (36.9 C) 98.1 F (36.7 C) 98.2 F (36.8 C)  TempSrc:  Oral Oral Oral  Resp:  Height:      Weight:      SpO2:  96% 93% 95%    Intake/Output Summary (Last 24 hours) at 02/20/15 1720 Last data filed at 02/20/15 1300  Gross per 24 hour  Intake    755 ml  Output      0 ml  Net    755 ml   Filed Weights   02/16/15 1720 02/18/15 0609  Weight: 47.9 kg (105 lb 9.6 oz) 48.9 kg (107 lb 12.9 oz)     Exam:  General exam: Pleasant elderly female sitting up and eating breakfast this morning. Respiratory system: Clear. No increased work of breathing. Cardiovascular system: S1 & S2 heard, RRR. No JVD, murmurs, gallops, clicks or pedal edema. Telemetry: AV paced rhythm Gastrointestinal system: Abdomen is nondistended, soft and nontender. Normal bowel sounds heard. Central nervous system: Alert and oriented. No focal neurological deficits. Extremities: Symmetric 5 x 5 power.   Data Reviewed: Basic Metabolic Panel:  Recent Labs Lab 02/16/15 1326 02/16/15 1631 02/17/15 0806 02/18/15 0507 02/20/15 0425  NA 128*  --  129* 130* 127*  K 3.1*  --  3.7 4.5 4.3  CL 93*  --  97* 103 101  CO2 28  --  GLUCOSE 127*  --  112* 110* 88  BUN 18  --  CREATININE 0.75 0.68 0.73 0.64 0.63  CALCIUM 9.0  --  8.5* 8.4* 8.6*  MG  --  1.5*  --  1.9 1.5*   Liver Function Tests:  Recent Labs Lab 02/17/15 0806 02/18/15 0507  AST 28 23  ALT 15 13*  ALKPHOS 70 70  BILITOT 1.9* 1.2  PROT 5.8* 5.3*  ALBUMIN 3.3* 2.9*   No results for input(s): LIPASE, AMYLASE in the last 168 hours. No results for input(s): AMMONIA in the last 168 hours. CBC:  Recent Labs Lab 02/16/15 1326 02/16/15 1631 02/17/15 0806 02/18/15 0507 02/20/15 0425  WBC 9.0 11.6* 10.1 10.5 9.0  NEUTROABS  --   --   --  8.8*  --   HGB 9.1* 9.2* 11.0* 10.2* 9.3*  HCT 26.6* 28.3*  32.2* 29.6* 28.8*  MCV 86.6 86.8 86.8 87.3 89.2  PLT 375 379 357 340 321   Cardiac Enzymes:  Recent Labs Lab 02/16/15 1631 02/16/15 2100 02/17/15 0806  TROPONINI 0.03 <0.03 <0.03   BNP (last 3 results) No results for input(s): PROBNP in the last 8760 hours. CBG: No results for input(s): GLUCAP in the last 168 hours.  Recent Results (from the past 240 hour(s))  Urine culture     Status: None (Preliminary result)   Collection Time: 02/17/15  8:00 AM  Result Value Ref Range Status   Specimen Description URINE, CLEAN CATCH  Final   Special Requests NONE  Final   Culture   Final    >=100,000 COLONIES/mL GRAM NEGATIVE RODS Performed at [facility]    Report Status PENDING  Incomplete         Studies: Dg Humerus Right  02/20/2015   CLINICAL DATA:  Irregularity of humerus seen on prior chest radiograph.  EXAM: RIGHT HUMERUS - 2+ VIEW  COMPARISON:  Chest radiograph of February 16, 2015.  FINDINGS: No fracture or dislocation is noted. No bone lesion is noted. There is severe narrowing of the right subacromial space suggesting rotator cuff injury. Superior subluxation of the right humeral head relative to glenoid fossa is noted.  IMPRESSION: No acute abnormality seen in the right humerus. Severe narrowing of right subacromial space is noted suggesting rotator cuff injury with subsequent superior subluxation of right humeral head relative to glenoid fossa.   Electronically Signed   By: Lupita Raider, M.D.   On: 02/20/2015 11:19        Scheduled Meds: . amLODipine  5 mg Oral Daily  . calcium-vitamin D  2 tablet Oral Daily  . cefTRIAXone (ROCEPHIN)  IV  1 g Intravenous Daily  . enoxaparin (LOVENOX) injection  40 mg Subcutaneous Q24H  . hydrALAZINE  25 mg Oral 3 times per day  . lisinopril  40 mg Oral Daily  . methimazole  5 mg Oral Daily  . pneumococcal 23 valent vaccine  0.5 mL Intramuscular Tomorrow-1000  . senna  2 tablet Oral Daily  . sertraline  100 mg Oral Daily    . simvastatin  20 mg Oral QPM  . sodium chloride  3 mL Intravenous Q12H   Continuous Infusions:   Principal Problem:   Multiple pelvic fractures Active Problems:   Hypertension   Hyperthyroidism   Third degree AV block   Peripheral vascular disease   Pacemaker   Pelvic pain in female   Acute hyponatremia   Hypokalemia   Fall   Pressure ulcer   Acute blood loss anemia    Time spent: 25 minutes.    Marcellus Scott, MD, FACP, FHM. [facility] Pager [phone]  If 7PM-7AM, please contact night-coverage [email or web address] Password [email or web address] 02/20/2015, 5:20 PM    LOS: 4 days

## 2015-02-20 NOTE — Progress Notes (Signed)
CSW continuing to follow.   CSW received phone call from pt granddaughter, [name]. Pt granddaughter stated that she reviewed SNF bed offers and first choice is [facility].   CSW contacted [facility] and notified facility of pt acceptance of bed offer. [facility] confirmed that they could accept pt when pt medically stable for discharge.  CSW to continue to follow to provide support and assist with pt discharge planning needs when pt medically ready for discharge.  Loletta Specter, MSW, LCSW Clinical Social Work [phone]

## 2015-02-21 LAB — BASIC METABOLIC PANEL
Anion gap: 5 (ref 5–15)
BUN: 10 mg/dL (ref 6–20)
CHLORIDE: 99 mmol/L — AB (ref 101–111)
CO2: 25 mmol/L (ref 22–32)
Calcium: 8.8 mg/dL — ABNORMAL LOW (ref 8.9–10.3)
Creatinine, Ser: 0.7 mg/dL (ref 0.44–1.00)
GFR calc Af Amer: >60 mL/min (ref >60)
GFR calc non Af Amer: >60 mL/min (ref >60)
Glucose, Bld: 93 mg/dL (ref 65–99)
POTASSIUM: 4.1 mmol/L (ref 3.5–5.1)
SODIUM: 129 mmol/L — AB (ref 135–145)

## 2015-02-21 LAB — CBC
HEMATOCRIT: 29.4 % — AB (ref 36.0–46.0)
Hemoglobin: 10 g/dL — ABNORMAL LOW (ref 12.0–15.0)
MCH: 30.1 pg (ref 26.0–34.0)
MCHC: 34 g/dL (ref 30.0–36.0)
MCV: 88.6 fL (ref 78.0–100.0)
Platelets: 354 10*3/uL (ref 150–400)
RBC: 3.32 MIL/uL — AB (ref 3.87–5.11)
RDW: 17.5 % — AB (ref 11.5–15.5)
WBC: 9.1 10*3/uL (ref 4.0–10.5)

## 2015-02-21 LAB — MAGNESIUM: Magnesium: 1.7 mg/dL (ref 1.7–2.4)

## 2015-02-21 LAB — URINE CULTURE: Culture: >=100000

## 2015-02-21 MED ORDER — SENNA 8.6 MG PO TABS: 2.0000 | ORAL_TABLET | Freq: Every day | ORAL | Status: AC

## 2015-02-21 MED ORDER — ACETAMINOPHEN 325 MG PO TABS: 650.0000 mg | ORAL_TABLET | Freq: Four times a day (QID) | ORAL | Status: DC | PRN

## 2015-02-21 MED ORDER — CEPHALEXIN 250 MG PO CAPS: 250.0000 mg | ORAL_CAPSULE | Freq: Two times a day (BID) | ORAL | Status: DC

## 2015-02-21 MED ORDER — OXYCODONE HCL 5 MG PO TABS: 5.0000 mg | ORAL_TABLET | Freq: Four times a day (QID) | ORAL | Status: DC | PRN

## 2015-02-21 MED ORDER — ENOXAPARIN SODIUM 30 MG/0.3ML ~~LOC~~ SOLN: 30.0000 mg | SUBCUTANEOUS | Status: DC

## 2015-02-21 MED ORDER — BISACODYL 10 MG RE SUPP: 10.0000 mg | Freq: Every day | RECTAL | Status: AC | PRN

## 2015-02-21 NOTE — Clinical Social Work Placement (Signed)
   CLINICAL SOCIAL WORK PLACEMENT  NOTE  Date:  02/21/2015  Patient Details  Name: Kathryn [name] MRN: [id number] Date of Birth: [birth year]  Clinical Social Work is seeking post-discharge placement for this patient at the Skilled  Nursing Facility level of care (*CSW will initial, date and re-position this form in  chart as items are completed):  Yes   Patient/family provided with [facility] Clinical Social Work Department's list of facilities offering this level of care within the geographic area requested by the patient (or if unable, by the patient's family).  Yes   Patient/family informed of their freedom to choose among providers that offer the needed level of care, that participate in Medicare, Medicaid or managed care program needed by the patient, have an available bed and are willing to accept the patient.  Yes   Patient/family informed of [facility]'s ownership interest in [facility] and [facility], as well as of the fact that they are under no obligation to receive care at these facilities.  PASRR submitted to EDS on       PASRR number received on       Existing PASRR number confirmed on 02/19/15     FL2 transmitted to all facilities in geographic area requested by pt/family on 02/19/15     FL2 transmitted to all facilities within larger geographic area on 02/19/15     Patient informed that his/her managed care company has contracts with or will negotiate with certain facilities, including the following:        Yes   Patient/family informed of bed offers received.  Patient chooses bed at [facility]     Physician recommends and patient chooses bed at      Patient to be transferred to [facility] on 02/21/15.  Patient to be transferred to facility by ambulance [facility])     Patient family notified on 02/21/15 of transfer.  Name of family member notified:  pt notified at bedside and pt granddaughter, [name] notified via  telephone     PHYSICIAN Please sign FL2     Additional Comment:    _______________________________________________ Orson Eva, LCSW 02/21/2015, 2:11 PM

## 2015-02-21 NOTE — Progress Notes (Signed)
Report called to [facility], Charity fundraiser, at [employer]. Transportation arranged by SW. Awaiting for [facility] arrival. Will continue to monitor until d/c.

## 2015-02-21 NOTE — Discharge Summary (Signed)
Physician Discharge Summary  Kathryn [name] ZOX:[id number] DOB: [birth year] DOA: 02/16/2015  PCP: Sissy Hoff, MD  Admit date: 02/16/2015 Discharge date: 02/21/2015  Time spent: Greater than 30 minutes  Recommendations for Outpatient Follow-up:  1. M.D. at SNF in 5 days to be seen with repeat labs (CBC & BMP). 2. Teryl Lucy, Orthopedics in 1 week. SNF to arrange for appointment. 3. Dr. Tally Joe, PCP: Upon discharge from SNF.  Discharge Diagnoses:  Principal Problem:   Multiple pelvic fractures Active Problems:   Hypertension   Hyperthyroidism   Third degree AV block   Peripheral vascular disease   Pacemaker   Pelvic pain in female   Acute hyponatremia   Hypokalemia   Fall   Pressure ulcer   Acute blood loss anemia   Discharge Condition: Improved & Stable  Diet recommendation: Heart healthy diet.  Filed Weights   02/16/15 1720 02/18/15 0609  Weight: 47.9 kg (105 lb 9.6 oz) 48.9 kg (107 lb 12.9 oz)    History of present illness:  81 year old female with history of HTN, depression, dementia, right intertrochanteric fracture 2013, third-degree AV block status post PPM, chronic chest pain at pacemaker site, fell 8/12-CT head and C-spine, x-ray left elbow without abnormality, admitted to [facility] on 02/16/15 with complaints of difficulty ambulating since 8/12 and a large bruise on her left posterior thigh, and difficulty ambulating.  Hospital Course:   Multiple pelvic fractures (as per CT results below) - Dr. Albertine Grates discussed patient's case with Dr. Dion Saucier, Orthopedics who reviewed imaging studies and recommended nonoperative management with WBAT - Initial x-ray suggested possible right humeral fracture. Repeated x-ray 8/24: No fractures. - Pain controlled and as per nursing report, was able to stand up and get out of bed to chair.  Klebsiella UTI - Patient had mild leukocytosis on admission. History not reliable. Started on IV Rocephin pending urine  culture results  - Patient has completed 4 days of IV Rocephin. We will discharge on oral Keflex to complete total 7 days treatment.  Mechanical fall - On 8/12. SNF at discharge for rehabilitation.  Acute blood loss anemia - Status post 1 unit PRBC on 8/20. Stable. Follow CBCs periodically as outpatient.  Hyponatremia - Seems chronic and intermittent. Also seems asymptomatic. Etiology unclear. As discussed with patient's granddaughter on 8/24, this is chronic and she was unable to give information regarding diagnosis that was informed by PCP or her endocrinologist.  Hypokalemia/hypomagnesemia - Replaced  Pressure ulcer - Monitor.  Essential hypertension - Controlled on amlodipine, lisinopril. Hydralazine was not on her med list PTA and will be discontinued at DC.  Hyperthyroid - TSH normal (2.881). Continue Tapazole  Complete heart block status post PPM  Spiculated RLL mass on chest x-ray - CT chest shows no mass.  Constipation - Adjusted bowel regimen. Patient had BM this morning.  DO NOT RESUSCITATE   Consultants:  None  Procedures:  None.   Discharge Exam:  Complaints: Denies pain or any specific complaints. States that she had a BM this morning. As per nursing, no acute issues reported.  Filed Vitals:   02/20/15 0428 02/20/15 1318 02/20/15 2104 02/21/15 0436  BP: 154/48 149/58 131/46 142/54  Pulse: 65 68 67 70  Temp: 98.1 F (36.7 C) 98.2 F (36.8 C) 98.8 F (37.1 C) 98.6 F (37 C)  TempSrc: Oral Oral Oral Oral  Resp: 16 18 18 18   Height:      Weight:      SpO2: 93% 95% 95% 95%  General exam: Pleasant elderly female sitting up comfortably in bed this morning. Respiratory system: Clear. No increased work of breathing. Cardiovascular system: S1 & S2 heard, RRR. No JVD, murmurs, gallops, clicks or pedal edema.  Gastrointestinal system: Abdomen is nondistended, soft and nontender. Normal bowel sounds heard. Central nervous system: Alert and  oriented. No focal neurological deficits. Extremities: Symmetric 5 x 5 power.  Discharge Instructions      Discharge Instructions    Call MD for:  difficulty breathing, headache or visual disturbances    Complete by:  As directed      Call MD for:  extreme fatigue    Complete by:  As directed      Call MD for:  persistant dizziness or light-headedness    Complete by:  As directed      Call MD for:  persistant nausea and vomiting    Complete by:  As directed      Call MD for:  severe uncontrolled pain    Complete by:  As directed      Call MD for:  temperature >100.4    Complete by:  As directed      Diet - low sodium heart healthy    Complete by:  As directed      Increase activity slowly    Complete by:  As directed             Medication List    STOP taking these medications        hydrALAZINE 25 MG tablet  Commonly known as:  APRESOLINE      TAKE these medications        acetaminophen 325 MG tablet  Commonly known as:  TYLENOL  Take 2 tablets (650 mg total) by mouth every 6 (six) hours as needed for mild pain, fever or headache (or Fever >/= 101).     amLODipine 5 MG tablet  Commonly known as:  NORVASC  Take 5 mg by mouth daily.     aspirin EC 325 MG tablet  Take 325 mg by mouth daily.     bisacodyl 10 MG suppository  Commonly known as:  DULCOLAX  Place 1 suppository (10 mg total) rectally daily as needed for moderate constipation.     CALCIUM 1000 + D PO  Take 1 tablet by mouth daily.     cephALEXin 250 MG capsule  Commonly known as:  KEFLEX  Take 1 capsule (250 mg total) by mouth 2 (two) times daily. Start 02/22/15 and stop after 02/24/15 doses.  Start taking on:  02/22/2015     cholecalciferol 1000 UNITS tablet  Commonly known as:  VITAMIN D  Take 1,000 Units by mouth daily.     fish oil-omega-3 fatty acids 1000 MG capsule  Take 1 g by mouth daily.     furosemide 20 MG tablet  Commonly known as:  LASIX  Take 20 mg by mouth daily as needed for  fluid or edema.     lisinopril 40 MG tablet  Commonly known as:  PRINIVIL,ZESTRIL  Take 1 tablet by mouth daily.     methimazole 5 MG tablet  Commonly known as:  TAPAZOLE  Take 5 mg by mouth daily.     OCUVITE PRESERVISION PO  Take 1 tablet by mouth daily.     oxyCODONE 5 MG immediate release tablet  Commonly known as:  Oxy IR/ROXICODONE  Take 1 tablet (5 mg total) by mouth every 6 (six) hours as needed for moderate pain or  severe pain.     senna 8.6 MG Tabs tablet  Commonly known as:  SENOKOT  Take 2 tablets (17.2 mg total) by mouth daily.     sertraline 100 MG tablet  Commonly known as:  ZOLOFT  Take 100 mg by mouth daily.     simvastatin 20 MG tablet  Commonly known as:  ZOCOR  Take 20 mg by mouth every evening.     vitamin B-12 1000 MCG tablet  Commonly known as:  CYANOCOBALAMIN  Take 1,000 mcg by mouth daily.     vitamin E 600 UNIT capsule  Take 600 Units by mouth daily.       Follow-up Information    Follow up with Eulas Post, MD. Schedule an appointment as soon as possible for a visit in 1 week.   Specialty:  Orthopedic Surgery   Contact information:   [address] [place] [address] [phone]       Follow up with M.D. at SNF. Schedule an appointment as soon as possible for a visit in 5 days.   Why:  To be seen with repeat labs (CBC & BMP).      Schedule an appointment as soon as possible for a visit with Sissy Hoff, MD.   Specialty:  Family Medicine   Why:  Upon discharge from SNF.   Contact information:   [address] [place] [address] [phone]        The results of significant diagnostics from this hospitalization (including imaging, microbiology, ancillary and laboratory) are listed below for reference.    Significant Diagnostic Studies: Ct Abdomen Pelvis Wo Contrast  02/16/2015   CLINICAL DATA:  Status post fall at home 2 weeks ago. Right hip pain, back pain and difficulty  ambulating. Severe pelvic pain. Initial encounter.  EXAM: CT ABDOMEN AND PELVIS WITHOUT CONTRAST  TECHNIQUE: Multidetector CT imaging of the abdomen and pelvis was performed following the standard protocol without IV contrast.  COMPARISON:  Lumbar spine and pelvic radiographs performed earlier today at 1:50 p.m.  FINDINGS: Trace bilateral pleural effusions are noted, with bibasilar atelectasis, left greater than right. Dense calcification is noted at the mitral valve. Pacemaker leads are partially imaged. A small to moderate hiatal hernia is seen.  Scattered calcified granulomata are noted within the liver. The liver and spleen are otherwise unremarkable. The gallbladder is not definitely seen. The pancreas and adrenal glands are unremarkable.  There is diffuse calcification along the left renal artery, with marked left renal atrophy. The right kidney is grossly unremarkable in appearance, though difficult to fully assess due to motion artifact. There is no evidence of hydronephrosis. No renal or ureteral stones are seen. No perinephric stranding is appreciated.  No free fluid is identified. The small bowel is unremarkable in appearance. The stomach is within normal limits. No acute vascular abnormalities are seen. Diffuse calcification is seen along the abdominal aorta and its branches, including along the superior mesenteric artery and proximal inferior mesenteric artery. There is likely severe luminal narrowing at the distal abdominal aorta and along the common iliac arteries and external iliac arteries bilaterally.  The patient is status post appendectomy. Mild diverticulosis is noted along the sigmoid colon, without evidence of diverticulitis.  The bladder is mildly distended and grossly unremarkable. The uterus is grossly unremarkable in appearance. The ovaries are grossly symmetric. No suspicious adnexal masses are seen. No inguinal lymphadenopathy is seen. Nonspecific presacral soft tissue stranding is  noted.  There are  comminuted fractures involving the superior and inferior pubic rami bilaterally, with both medial and lateral fracture lines at the right superior pubic ramus, a medial fracture line at the left superior pubic ramus, a displaced fracture at the right inferior pubic ramus, and a comminuted fracture at the left inferior pubic ramus. There is extension of fracture lines to the pubic symphysis on both sides. There appears to be a resolving hematoma just superior to the pubic symphysis, measuring approximately 5.6 x 2.7 x 1.5 cm. Associated ossification within the hematoma is thought to reflect callus formation.  There is a mildly comminuted fracture involving the right sacral ala, extending to the right sacroiliac joint, without evidence of joint space widening. Right femoral hardware is incompletely imaged, but appears grossly unremarkable. Multilevel vacuum phenomenon is noted along the lower thoracic and lumbar spine. Endplate degenerative change is noted at the lower lumbar spine.  IMPRESSION: 1. Comminuted fractures involving the superior and inferior pubic rami bilaterally, as described above, with extension of fracture lines to the pubis symphysis on both sides. Associated resolving hematoma just superior to the pubic symphysis, measuring 5.6 x 2.7 x 1.5 cm. Ossification within the hematoma is thought to reflect callus formation. 2. Mildly comminuted fracture involving the right sacral ala, extending to the right sacroiliac joint, without evidence of joint space widening. 3. Diffuse calcification along the abdominal aorta and its branches, including along the superior mesenteric artery and proximal inferior mesenteric artery. Likely severe luminal narrowing noted at the distal abdominal aorta and along the common iliac arteries and external iliac arteries bilaterally. 4. Diffuse calcification along the left renal artery, with marked left renal atrophy. 5. Trace bilateral pleural effusions, with  bibasilar atelectasis, left greater than right. 6. Dense calcification at the mitral valve. 7. Small to moderate hiatal hernia noted. 8. Mild diverticulosis along the sigmoid colon, without evidence of diverticulitis. These results were called by telephone at the time of interpretation on 02/16/2015 at 6:19 pm to [facility] on [id number], who verbally acknowledged these results.   Electronically Signed   By: Roanna Raider M.D.   On: 02/16/2015 18:23   Dg Thoracic Spine W/swimmers  02/16/2015   CLINICAL DATA:  Fall 2 weeks ago with persistent back pain, initial encounter  EXAM: THORACIC SPINE - 3 VIEWS  COMPARISON:  None.  FINDINGS: A compression deformity is again noted at T11. No other compression deformities are seen. Degenerative changes are seen. Diffuse aortic calcifications are noted. Degenerative changes in the visualized cervical spine are noted as well.  IMPRESSION: Chronic appearing compression deformity at T11. No other focal abnormality is seen.   Electronically Signed   By: Alcide Clever M.D.   On: 02/16/2015 14:24   Dg Lumbar Spine Complete  02/16/2015   CLINICAL DATA:  Difficulty ambulating  EXAM: LUMBAR SPINE - COMPLETE 4+ VIEW  COMPARISON:  None.  FINDINGS: Five lumbar type vertebral bodies are well visualized. Vertebral body height is well maintained. Disc space narrowing with endplate sclerosis is noted most marked at the L4-5 interspace. Mild compression deformities noted at T11 of uncertain chronicity. No soft tissue abnormality is seen. Facet hypertrophic changes are noted.  IMPRESSION: Multilevel degenerative changes without acute abnormality.   Electronically Signed   By: Alcide Clever M.D.   On: 02/16/2015 14:22   Dg Pelvis 1-2 Views  02/16/2015   CLINICAL DATA:  Fall 2 weeks ago with increasing difficulty with ambulation and right hip pain  EXAM: PELVIS - 1-2 VIEW  COMPARISON:  09/23/2011  FINDINGS: Medullary rod with compression screw is seen in the proximal right femur. The pelvic ring is  intact. No definitive acute fracture is seen. Degenerative changes of the lumbar spine are noted.  IMPRESSION: Chronic changes without acute abnormality.   Electronically Signed   By: Alcide Clever M.D.   On: 02/16/2015 14:21   Dg Elbow Complete Left  02/08/2015   CLINICAL DATA:  Fall  EXAM: LEFT ELBOW - COMPLETE 3+ VIEW  COMPARISON:  None.  FINDINGS: Five views of the left elbow submitted. No acute fracture or subluxation. No posterior fat pad sign.  IMPRESSION: Negative.   Electronically Signed   By: Natasha Mead M.D.   On: 02/08/2015 15:30   Ct Head Wo Contrast  02/08/2015   CLINICAL DATA:  Larey Seat yesterday onto LEFT side, tripped when startled by door bell, struck head, no loss of consciousness, history hypertension, hyperlipidemia, third-degree heart block, former smoker  EXAM: CT HEAD WITHOUT CONTRAST  CT CERVICAL SPINE WITHOUT CONTRAST  TECHNIQUE: Multidetector CT imaging of the head and cervical spine was performed following the standard protocol without intravenous contrast. Multiplanar CT image reconstructions of the cervical spine were also generated.  COMPARISON:  10/14/2011  FINDINGS: CT HEAD FINDINGS  Generalized atrophy.  Diffuse ex vacuo dilatation of ventricular system, unchanged.  No midline shift or mass effect.  Small vessel chronic ischemic changes of deep cerebral white matter.  Old RIGHT basal ganglia lacunar infarcts.  No intracranial hemorrhage, mass lesion or evidence acute infarction.  No extra-axial fluid collections.  Stable calcification at medial RIGHT frontal lobe.  Bones demineralized without identified fracture.  Atherosclerotic calcification of internal carotid and vertebral arteries at skullbase.  CT CERVICAL SPINE FINDINGS  Extensive atherosclerotic calcification in the carotid systems.  Multiple BILATERAL thyroid nodules again identified grossly unchanged since 2013.  Visualized skullbase intact.  Prevertebral soft tissues normal thickness.  Multilevel disc space narrowing and  endplate spur formation cervical spine.  Vertebral body heights maintained without fracture or subluxation.  Diffuse multilevel facet degenerative changes as well.  Small amount of calcified pannus surrounding odontoid process.  Lung apices clear.  IMPRESSION: Generalized atrophy with ex vacuo dilatation of the ventricular system.  Small vessel chronic ischemic changes of deep cerebral white matter.  Old RIGHT basal ganglia lacunar infarcts.  No acute intracranial abnormalities.  Osseous demineralization with multilevel degenerative disc and facet disease changes of the cervical spine.  No acute cervical spine abnormalities.   Electronically Signed   By: Ulyses Southward M.D.   On: 02/08/2015 16:13   Ct Chest W Contrast  02/18/2015   CLINICAL DATA:  Abnormal chest x-ray with questionable right lower lobe spiculated mass. Post fall.  EXAM: CT CHEST WITH CONTRAST  TECHNIQUE: Multidetector CT imaging of the chest was performed during intravenous contrast administration.  CONTRAST:  80mL OMNIPAQUE IOHEXOL 300 MG/ML  SOLN  COMPARISON:  Radiograph 02/16/2015  FINDINGS: There is no nodule or mass in the right lower lobe corresponding to abnormality on chest radiograph. Mild emphysematous change with increased AP diameter of the thorax. Small bilateral pleural effusions with adjacent compressive atelectasis. No consolidation to suggest pneumonia.  Left-sided pacemaker in place. Mild multi chamber cardiomegaly. Coronary artery calcifications are seen. Dense atherosclerosis of normal caliber thoracic aorta. No aneurysm. There is no pericardial effusion. Moderate hiatal hernia. There is no mediastinal or hilar adenopathy.  Exaggerated thoracic kyphosis with multilevel degenerative change. Remote sternal fracture with callus formation. There are old right rib fractures. No acute rib fracture. Degenerative  change in the right shoulder.  Evaluation of the upper abdomen demonstrates atrophy of the left kidney. No acute abnormality  is seen. Calcified granulomas in the liver.  IMPRESSION: 1. No pulmonary mass or spiculated nodule corresponding to abnormality on radiograph, with particular attention to the right lower lobe. 2. Small pleural effusions with adjacent compressive atelectasis. 3. Atherosclerosis including the coronary arteries. 4. Moderate hiatal hernia.   Electronically Signed   By: Rubye Oaks M.D.   On: 02/18/2015 03:21   Ct Cervical Spine Wo Contrast  02/08/2015   CLINICAL DATA:  Larey Seat yesterday onto LEFT side, tripped when startled by door bell, struck head, no loss of consciousness, history hypertension, hyperlipidemia, third-degree heart block, former smoker  EXAM: CT HEAD WITHOUT CONTRAST  CT CERVICAL SPINE WITHOUT CONTRAST  TECHNIQUE: Multidetector CT imaging of the head and cervical spine was performed following the standard protocol without intravenous contrast. Multiplanar CT image reconstructions of the cervical spine were also generated.  COMPARISON:  10/14/2011  FINDINGS: CT HEAD FINDINGS  Generalized atrophy.  Diffuse ex vacuo dilatation of ventricular system, unchanged.  No midline shift or mass effect.  Small vessel chronic ischemic changes of deep cerebral white matter.  Old RIGHT basal ganglia lacunar infarcts.  No intracranial hemorrhage, mass lesion or evidence acute infarction.  No extra-axial fluid collections.  Stable calcification at medial RIGHT frontal lobe.  Bones demineralized without identified fracture.  Atherosclerotic calcification of internal carotid and vertebral arteries at skullbase.  CT CERVICAL SPINE FINDINGS  Extensive atherosclerotic calcification in the carotid systems.  Multiple BILATERAL thyroid nodules again identified grossly unchanged since 2013.  Visualized skullbase intact.  Prevertebral soft tissues normal thickness.  Multilevel disc space narrowing and endplate spur formation cervical spine.  Vertebral body heights maintained without fracture or subluxation.  Diffuse  multilevel facet degenerative changes as well.  Small amount of calcified pannus surrounding odontoid process.  Lung apices clear.  IMPRESSION: Generalized atrophy with ex vacuo dilatation of the ventricular system.  Small vessel chronic ischemic changes of deep cerebral white matter.  Old RIGHT basal ganglia lacunar infarcts.  No acute intracranial abnormalities.  Osseous demineralization with multilevel degenerative disc and facet disease changes of the cervical spine.  No acute cervical spine abnormalities.   Electronically Signed   By: Ulyses Southward M.D.   On: 02/08/2015 16:13   Portable Chest 1 View  02/16/2015   CLINICAL DATA:  Patient status post fall.  EXAM: PORTABLE CHEST - 1 VIEW  COMPARISON:  Chest radiograph 09/27/2011; thoracic spine radiograph 02/16/2015  FINDINGS: Dual lead pacer apparatus overlies the left hemi thorax. Stable enlarged cardiac and mediastinal contours. There is a rounded density projecting over the expected location of the central mediastinum. Lungs are hyperexpanded. Bilateral interstitial pulmonary opacities. Suggestion of possible spiculated mass within the right lower lung. Irregular appearance of the proximal right humerus. Age-indeterminate but likely old lateral right rib fractures. Carotid vascular calcifications.  IMPRESSION: Possible spiculated mass within right lower lobe. Recommend dedicated evaluation with contrast-enhanced chest CT.  Rounded density projecting over the central mediastinum is nonspecific and may be secondary to patient rotation. Recommend attention on above recommended CT.  Irregularity of the proximal right humerus. Recommend correlation with dedicated humeral radiographs to exclude fracture.  Age-indeterminate but likely old right lateral rib fractures.   Electronically Signed   By: Annia Belt M.D.   On: 02/16/2015 17:30   Dg Humerus Right  02/20/2015   CLINICAL DATA:  Irregularity of humerus seen on prior chest radiograph.  EXAM: RIGHT HUMERUS -  2+ VIEW  COMPARISON:  Chest radiograph of February 16, 2015.  FINDINGS: No fracture or dislocation is noted. No bone lesion is noted. There is severe narrowing of the right subacromial space suggesting rotator cuff injury. Superior subluxation of the right humeral head relative to glenoid fossa is noted.  IMPRESSION: No acute abnormality seen in the right humerus. Severe narrowing of right subacromial space is noted suggesting rotator cuff injury with subsequent superior subluxation of right humeral head relative to glenoid fossa.   Electronically Signed   By: Lupita Raider, M.D.   On: 02/20/2015 11:19    Microbiology: Recent Results (from the past 240 hour(s))  Urine culture     Status: None   Collection Time: 02/17/15  8:00 AM  Result Value Ref Range Status   Specimen Description URINE, CLEAN CATCH  Final   Special Requests NONE  Final   Culture   Final    >=100,000 COLONIES/mL KLEBSIELLA SPECIES Performed at [facility]    Report Status 02/21/2015 FINAL  Final   Organism ID, Bacteria KLEBSIELLA SPECIES  Final      Susceptibility   Klebsiella species - MIC*    AMPICILLIN 16 RESISTANT Resistant     CEFAZOLIN <=4 SENSITIVE Sensitive     CEFTRIAXONE <=1 SENSITIVE Sensitive     CIPROFLOXACIN <=0.25 SENSITIVE Sensitive     GENTAMICIN <=1 SENSITIVE Sensitive     IMIPENEM 2 SENSITIVE Sensitive     NITROFURANTOIN 128 RESISTANT Resistant     TRIMETH/SULFA <=20 SENSITIVE Sensitive     AMPICILLIN/SULBACTAM <=2 SENSITIVE Sensitive     PIP/TAZO <=4 SENSITIVE Sensitive     * >=100,000 COLONIES/mL KLEBSIELLA SPECIES     Labs: Basic Metabolic Panel:  Recent Labs Lab 02/16/15 1326 02/16/15 1631 02/17/15 0806 02/18/15 0507 02/20/15 0425 02/21/15 0433  NA 128*  --  129* 130* 127* 129*  K 3.1*  --  3.7 4.5 4.3 4.1  CL 93*  --  97* 103 101 99*  CO2 28  --  GLUCOSE 127*  --  112* 110* 88 93  BUN 18  --  CREATININE 0.75 0.68 0.73 0.64 0.63 0.70  CALCIUM  9.0  --  8.5* 8.4* 8.6* 8.8*  MG  --  1.5*  --  1.9 1.5* 1.7   Liver Function Tests:  Recent Labs Lab 02/17/15 0806 02/18/15 0507  AST 28 23  ALT 15 13*  ALKPHOS 70 70  BILITOT 1.9* 1.2  PROT 5.8* 5.3*  ALBUMIN 3.3* 2.9*   No results for input(s): LIPASE, AMYLASE in the last 168 hours. No results for input(s): AMMONIA in the last 168 hours. CBC:  Recent Labs Lab 02/16/15 1631 02/17/15 0806 02/18/15 0507 02/20/15 0425 02/21/15 0433  WBC 11.6* 10.1 10.5 9.0 9.1  NEUTROABS  --   --  8.8*  --   --   HGB 9.2* 11.0* 10.2* 9.3* 10.0*  HCT 28.3* 32.2* 29.6* 28.8* 29.4*  MCV 86.8 86.8 87.3 89.2 88.6  PLT 379 357 340 321 354   Cardiac Enzymes:  Recent Labs Lab 02/16/15 1631 02/16/15 2100 02/17/15 0806  TROPONINI 0.03 <0.03 <0.03   BNP: BNP (last 3 results) No results for input(s): BNP in the last 8760 hours.  ProBNP (last 3 results) No results for input(s): PROBNP in the last 8760 hours.  CBG: No results for input(s): GLUCAP in the last 168 hours.   Discussed at length  with patient's granddaughter Ms. Aubery Lapping on 8/24. Updated care and answered questions.  Signed:  Marcellus Scott, MD, FACP, FHM. [facility] Pager [phone]  If 7PM-7AM, please contact night-coverage [email or web address] Password [email or web address] 02/21/2015, 11:58 AM

## 2015-02-21 NOTE — Progress Notes (Signed)
Pt for discharge to [facility].  CSW facilitated pt discharge needs including contacting facility, faxing pt discharge information via [facility], discussing with pt at bedside and notified pt granddaughter, [name] via telephone, providing RN phone number to call report, and arranging ambulance transport for pt to [facility] scheduled for 2:30 pm pick up.  Pt pleased to be able to go to [place] for short term rehab needs.  No further social work needs identified at this time.  CSW signing off.   Loletta Specter, MSW, LCSW Clinical Social Work [phone]

## 2015-02-25 ENCOUNTER — Ambulatory Visit: Payer: Medicare Other | Admitting: Podiatry

## 2015-03-06 ENCOUNTER — Ambulatory Visit: Payer: Medicare Other | Admitting: Podiatry

## 2015-06-28 ENCOUNTER — Encounter: Payer: Self-pay | Admitting: *Deleted

## 2015-07-16 ENCOUNTER — Telehealth: Payer: Self-pay | Admitting: Internal Medicine

## 2015-07-16 NOTE — Telephone Encounter (Signed)
°  New Message  Pt is now at [facility]. The staff is req a call back to determine if the pt can have remote device check//

## 2015-07-17 NOTE — Telephone Encounter (Signed)
Spoke w/ Melissa from pt nursing home and informed her that pt doesn't do remote monitoring. Pt will need to come to the office every 6 months to have device interrogated. Caregiver verbalized understanding.

## 2015-07-31 ENCOUNTER — Encounter: Payer: Self-pay | Admitting: *Deleted

## 2015-09-25 ENCOUNTER — Ambulatory Visit (INDEPENDENT_AMBULATORY_CARE_PROVIDER_SITE_OTHER): Payer: Medicare Other | Admitting: *Deleted

## 2015-09-25 DIAGNOSIS — I442 Atrioventricular block, complete: Secondary | ICD-10-CM | POA: Diagnosis not present

## 2015-09-25 LAB — CUP PACEART INCLINIC DEVICE CHECK
Battery Remaining Longevity: 72 mo
Brady Statistic RA Percent Paced: 6 %
Brady Statistic RV Percent Paced: 99 %
Implantable Lead Implant Date: 20130328
Implantable Lead Location: 753859
Implantable Lead Location: 753860
Implantable Lead Model: 4135
Implantable Lead Serial Number: 29147331
Implantable Lead Serial Number: 29151838
Lead Channel Impedance Value: 634 Ohm
Lead Channel Pacing Threshold Amplitude: 0.7 V
Lead Channel Sensing Intrinsic Amplitude: 2 mV
MDC IDC LEAD IMPLANT DT: 20130328
MDC IDC LEAD MODEL: 4136
MDC IDC MSMT LEADCHNL RA IMPEDANCE VALUE: 471 Ohm
MDC IDC MSMT LEADCHNL RA PACING THRESHOLD AMPLITUDE: 0.5 V
MDC IDC MSMT LEADCHNL RA PACING THRESHOLD PULSEWIDTH: 0.4 ms
MDC IDC MSMT LEADCHNL RV PACING THRESHOLD PULSEWIDTH: 0.4 ms
MDC IDC SESS DTM: 20170329103945
Pulse Gen Serial Number: 126125

## 2015-09-25 NOTE — Progress Notes (Signed)
Pacemaker check in clinic. Device site without redness, edema, ecchymosis. Normal device function. Thresholds, sensing, impedances consistent with previous measurements. Device programmed to maximize longevity. (46) mode switches (<1%)---max dur. 7 sec---AT/AFL per EGMs. 4 ventricular high rate episodes noted--brief NSVT per EGMs. Device programmed at appropriate safety margins. Histogram distribution appropriate for patient activity level. Device programmed to optimize intrinsic conduction. Estimated longevity 6 years. Patient will follow up with [facility] in 3 months.

## 2015-10-11 ENCOUNTER — Encounter: Payer: Self-pay | Admitting: Internal Medicine

## 2015-12-19 ENCOUNTER — Ambulatory Visit (INDEPENDENT_AMBULATORY_CARE_PROVIDER_SITE_OTHER): Payer: Medicare Other | Admitting: Internal Medicine

## 2015-12-19 ENCOUNTER — Encounter: Payer: Self-pay | Admitting: Internal Medicine

## 2015-12-19 VITALS — BP 142/62 | HR 60 | Ht <= 58 in | Wt 107.0 lb

## 2015-12-19 DIAGNOSIS — I442 Atrioventricular block, complete: Secondary | ICD-10-CM | POA: Diagnosis not present

## 2015-12-19 DIAGNOSIS — I480 Paroxysmal atrial fibrillation: Secondary | ICD-10-CM | POA: Diagnosis not present

## 2015-12-19 LAB — CUP PACEART INCLINIC DEVICE CHECK
Implantable Lead Implant Date: 20130328
Implantable Lead Location: 753859
Implantable Lead Location: 753860
Implantable Lead Serial Number: 29151838
Lead Channel Impedance Value: 652 Ohm
Lead Channel Pacing Threshold Amplitude: 0.4 V
Lead Channel Pacing Threshold Amplitude: 0.5 V
Lead Channel Pacing Threshold Pulse Width: 0.4 ms
Lead Channel Pacing Threshold Pulse Width: 0.4 ms
Lead Channel Setting Pacing Amplitude: 1.2 V
Lead Channel Setting Pacing Pulse Width: 0.4 ms
MDC IDC LEAD IMPLANT DT: 20130328
MDC IDC LEAD MODEL: 4135
MDC IDC LEAD MODEL: 4136
MDC IDC LEAD SERIAL: 29147331
MDC IDC MSMT LEADCHNL RA SENSING INTR AMPL: 1.4 mV
MDC IDC MSMT LEADCHNL RV IMPEDANCE VALUE: 680 Ohm
MDC IDC MSMT LEADCHNL RV SENSING INTR AMPL: 15.8 mV
MDC IDC SESS DTM: 20170622040000
MDC IDC SET LEADCHNL RA PACING AMPLITUDE: 2 V
MDC IDC SET LEADCHNL RV SENSING SENSITIVITY: 2.5 mV
Pulse Gen Serial Number: 126125

## 2015-12-19 NOTE — Patient Instructions (Addendum)
Medication Instructions:  Your physician recommends that you continue on your current medications as directed. Please refer to the Current Medication list given to you today.  Labwork: None ordered  Testing/Procedures: None ordered  Follow-Up: Your physician wants you to follow-up in: 6 months with device clinic.  You will receive a reminder letter in the mail two months in advance. If you don't receive a letter, please call our office to schedule the follow-up appointment.  Your physician wants you to follow-up in: 1 year with Dr. Taylor.  You will receive a reminder letter in the mail two months in advance. If you don't receive a letter, please call our office to schedule the follow-up appointment.  If you need a refill on your cardiac medications before your next appointment, please call your pharmacy.  Thank you for choosing [facility]!!        

## 2015-12-19 NOTE — Progress Notes (Signed)
HPI [name] returns today for followup. She is a very pleasant 82 year old woman with symptomatic bradycardia, hypertension, status post permanent pacemaker insertion. She denies chest pain or shortness of breath. No syncope.  She denies syncope. No peripheral edema. No cough or hemoptysis. She has had trouble with her memory in the past year. She has had trouble walking and is using a wheelchair and a walker. She has had a single fall.   No Known Allergies   Current Outpatient Prescriptions  Medication Sig Dispense Refill  . amLODipine (NORVASC) 5 MG tablet Take 5 mg by mouth daily.    Marland Kitchen. aspirin EC 325 MG tablet Take 325 mg by mouth daily.    . bisacodyl (DULCOLAX) 10 MG suppository Place 1 suppository (10 mg total) rectally daily as needed for moderate constipation.    . Calcium Carb-Cholecalciferol (CALCIUM 1000 + D PO) Take 1 tablet by mouth daily.     . cholecalciferol (VITAMIN D) 1000 UNITS tablet Take 1,000 Units by mouth daily.    . fish oil-omega-3 fatty acids 1000 MG capsule Take 1 g by mouth daily.    . furosemide (LASIX) 20 MG tablet Take 20 mg by mouth daily as needed for fluid or edema.    Marland Kitchen. lisinopril (PRINIVIL,ZESTRIL) 40 MG tablet Take 1 tablet by mouth daily.    . methimazole (TAPAZOLE) 5 MG tablet Take 5 mg by mouth daily.    . Multiple Vitamins-Minerals (OCUVITE PRESERVISION PO) Take 1 tablet by mouth daily.    Marland Kitchen. senna (SENOKOT) 8.6 MG TABS tablet Take 2 tablets (17.2 mg total) by mouth daily.    . sertraline (ZOLOFT) 100 MG tablet Take 100 mg by mouth daily.    . simvastatin (ZOCOR) 20 MG tablet Take 20 mg by mouth every evening.    . vitamin B-12 (CYANOCOBALAMIN) 1000 MCG tablet Take 1,000 mcg by mouth daily.    . vitamin E 600 UNIT capsule Take 600 Units by mouth daily.     No current facility-administered medications for this visit.     Past Medical History  Diagnosis Date  . Hypertension   . Hyperlipemia   . Hyperthyroidism   . Arthritis   .  Depression   . Intertrochanteric fracture of right femur (HCC) 09/22/11    right; S/P fall  . Non-sustained ventricular tachycardia (HCC)   . Third degree AV block (HCC)   . Cerebrovascular disease     80 % left ICA  . Macular degeneration     Substantial visual impairment  . Peripheral vascular disease (HCC)     Femoral bruit; decreased distal pulses    ROS:   All systems reviewed and negative except as noted in the HPI.   Past Surgical History  Procedure Laterality Date  . Appendectomy  1943  . Laryngeal nodule  ~ 1960's    removed  . Permanent pacemaker insertion Left 09/24/2011    Procedure: PERMANENT PACEMAKER INSERTION;  Surgeon: Marinus MawGregg W Harlem Bula, MD;  Location[facility] CATH LAB;  Service: Cardiovascular;  Laterality: Left;     Family History  Problem Relation Age of Onset  . Diabetes Maternal Aunt   . Diabetes Maternal Uncle   . Cancer Maternal Grandmother      Social History   Social History  . Marital Status: Single    Spouse Name: N/A  . Number of Children: N/A  . Years of Education: N/A   Occupational History  . Not on file.   Social History Main Topics  .  Smoking status: Former Smoker -- 1.00 packs/day for 66 years    Types: Cigarettes    Quit date: 06/21/2011  . Smokeless tobacco: Never Used  . Alcohol Use: No  . Drug Use: No  . Sexual Activity: No   Other Topics Concern  . Not on file   Social History Narrative     BP 142/62 mmHg  Pulse 60  Ht 4\' 10"  (1.473 m)  Wt 107 lb (48.535 kg)  BMI 22.37 kg/m2  Physical Exam:  Frail appearing elderly woman, NAD HEENT: Unremarkable Neck:  6 cm JVD, no thyromegally Lungs:  Clear with no wheezes, rales, or rhonchi. HEART:  Regular rate rhythm, no murmurs, no rubs, no clicks Abd:  soft, positive bowel sounds, no organomegally, no rebound, no guarding Ext:  2 plus pulses, no edema, no cyanosis, no clubbing Skin:  No rashes no nodules Neuro:  CN II through XII intact, motor grossly intact  DEVICE   Normal device function.  See PaceArt for details.   Assess/Plan:  1. PAF - she is maintaining NSR 99% of the time. She will continue her current meds. 2. HTN - her blood pressure is reasonably well controlled. I have discussed with her family that we do not want to keep her blood pressure too low. 3. PPM - her [employer] DDD PM is working normally. Will follow.  Kathryn BuntingGregg Ivyanna [name], M.D.

## 2016-03-19 ENCOUNTER — Telehealth: Payer: Self-pay | Admitting: Cardiology

## 2016-03-19 ENCOUNTER — Encounter: Payer: Medicare Other | Admitting: *Deleted

## 2016-03-19 NOTE — Telephone Encounter (Signed)
LMOVM reminding pt to send remote transmission.   

## 2016-04-16 ENCOUNTER — Other Ambulatory Visit (HOSPITAL_COMMUNITY): Payer: Self-pay | Admitting: Internal Medicine

## 2016-04-16 DIAGNOSIS — R1310 Dysphagia, unspecified: Secondary | ICD-10-CM

## 2016-04-21 ENCOUNTER — Ambulatory Visit (HOSPITAL_COMMUNITY)
Admission: RE | Admit: 2016-04-21 | Discharge: 2016-04-21 | Disposition: A | Discharge status: Home / Self Care | Payer: Medicare Other | Source: Ambulatory Visit | Attending: Internal Medicine | Admitting: Internal Medicine

## 2016-04-21 DIAGNOSIS — R1310 Dysphagia, unspecified: Secondary | ICD-10-CM | POA: Insufficient documentation

## 2017-02-09 ENCOUNTER — Emergency Department (HOSPITAL_COMMUNITY): Payer: Medicare Other

## 2017-02-09 ENCOUNTER — Emergency Department (HOSPITAL_COMMUNITY)
Admission: EM | Admit: 2017-02-09 | Discharge: 2017-02-09 | Disposition: A | Discharge status: Home / Self Care | Payer: Medicare Other | Attending: Emergency Medicine | Admitting: Emergency Medicine

## 2017-02-09 ENCOUNTER — Encounter (HOSPITAL_COMMUNITY): Payer: Self-pay | Admitting: Emergency Medicine

## 2017-02-09 DIAGNOSIS — Z87891 Personal history of nicotine dependence: Secondary | ICD-10-CM | POA: Insufficient documentation

## 2017-02-09 DIAGNOSIS — Y9301 Activity, walking, marching and hiking: Secondary | ICD-10-CM | POA: Diagnosis not present

## 2017-02-09 DIAGNOSIS — S0083XA Contusion of other part of head, initial encounter: Secondary | ICD-10-CM | POA: Insufficient documentation

## 2017-02-09 DIAGNOSIS — W01198A Fall on same level from slipping, tripping and stumbling with subsequent striking against other object, initial encounter: Secondary | ICD-10-CM | POA: Diagnosis not present

## 2017-02-09 DIAGNOSIS — Z950 Presence of cardiac pacemaker: Secondary | ICD-10-CM | POA: Insufficient documentation

## 2017-02-09 DIAGNOSIS — I10 Essential (primary) hypertension: Secondary | ICD-10-CM | POA: Diagnosis not present

## 2017-02-09 DIAGNOSIS — Y999 Unspecified external cause status: Secondary | ICD-10-CM | POA: Insufficient documentation

## 2017-02-09 DIAGNOSIS — S0990XA Unspecified injury of head, initial encounter: Secondary | ICD-10-CM | POA: Diagnosis present

## 2017-02-09 DIAGNOSIS — E039 Hypothyroidism, unspecified: Secondary | ICD-10-CM | POA: Diagnosis not present

## 2017-02-09 DIAGNOSIS — Y92129 Unspecified place in nursing home as the place of occurrence of the external cause: Secondary | ICD-10-CM | POA: Diagnosis not present

## 2017-02-09 DIAGNOSIS — Z79899 Other long term (current) drug therapy: Secondary | ICD-10-CM | POA: Diagnosis not present

## 2017-02-09 MED ORDER — IBUPROFEN 200 MG PO TABS
200.0000 mg | ORAL_TABLET | Freq: Once | ORAL | Status: AC
  Administered 2017-02-09: 200 mg via ORAL
  Filled 2017-02-09: qty 1

## 2017-02-09 NOTE — ED Provider Notes (Signed)
[facility]-EMERGENCY DEPT Provider Note   CSN: [id number] Arrival date & time: 02/09/17  0707   History   Chief Complaint Chief Complaint  Patient presents with  . Fall  . Head Injury    HPI Kathryn [name] is a 83 y.o. female.  HPI Patient presents to the emergency room for evaluation of a head injury. Patient states she was walking this morning when she slipped and fell. She struck her forehead on the floor. Patient lives at a nursing facility and she was unable to stand without assistance. They noticed that she had a large hematoma on her right forehead and called EMS. Patient denies losing consciousness. She denies any neck pain. She has some soreness in her knees but this is not a new issue for her.  Patient denies any trouble with chest pain or shortness of breath. No vomiting or diarrhea. Past Medical History:  Diagnosis Date  . Arthritis   . Cerebrovascular disease    80 % left ICA  . Depression   . Hyperlipemia   . Hypertension   . Hyperthyroidism   . Intertrochanteric fracture of right femur (HCC) 09/22/11   right; S/P fall  . Macular degeneration    Substantial visual impairment  . Non-sustained ventricular tachycardia (HCC)   . Peripheral vascular disease (HCC)    Femoral bruit; decreased distal pulses  . Third degree AV block [facility])     Patient Active Problem List   Diagnosis Date Noted  . Pelvic pain in female 02/16/2015  . Acute hyponatremia 02/16/2015  . Hypokalemia 02/16/2015  . Fall 02/16/2015  . Pressure ulcer 02/16/2015  . Acute blood loss anemia 02/16/2015  . Multiple pelvic fractures 02/16/2015  . Pacemaker 12/15/2011  . Hypertension 09/23/2011    Class: Chronic  . Fracture, intertrochanteric, right femur (HCC) 09/23/2011  . Hyperlipidemia 09/23/2011  . Tobacco use 09/23/2011  . Hyperthyroidism 09/23/2011  . Heart block 09/23/2011  . History of cardiac arrest 09/23/2011  . Non-sustained ventricular tachycardia (HCC)   . Third degree AV block  (HCC)   . Cerebrovascular disease   . Peripheral vascular disease [facility])     Past Surgical History:  Procedure Laterality Date  . APPENDECTOMY  1943  . laryngeal nodule  ~ 1960's   removed  . PERMANENT PACEMAKER INSERTION Left 09/24/2011   Procedure: PERMANENT PACEMAKER INSERTION;  Surgeon: Marinus Maw, MD;  Location[facility] C[facility] LAB;  Service: Cardiovascular;  Laterality: Left;    OB History    No data available       Home Medications    Prior to Admission medications   Medication Sig Start Date End Date Taking? Authorizing Provider  acetaminophen (TYLENOL) 325 MG tablet Take 650 mg by mouth every 6 (six) hours as needed for mild pain, fever or headache.   Yes [provider]  amLODipine (NORVASC) 5 MG tablet Take 5 mg by mouth daily.   Yes [provider]  bisacodyl (DULCOLAX) 10 MG suppository Place 1 suppository (10 mg total) rectally daily as needed for moderate constipation. 02/21/15  Yes Hongalgi, Maximino Greenland, MD  Calcium Carb-Cholecalciferol (CALCIUM 1000 + D PO) Take 1 tablet by mouth daily.    Yes [provider]  cholecalciferol (VITAMIN D) 1000 UNITS tablet Take 1,000 Units by mouth daily.   Yes [provider]  dextromethorphan-guaiFENesin (MUCINEX DM) 30-600 MG 12hr tablet Take 1 tablet by mouth 2 (two) times daily.   Yes [provider]  furosemide (LASIX) 20 MG tablet Take 20  mg by mouth daily as needed for fluid or edema.   Yes [provider]  guaiFENesin-dextromethorphan (ROBITUSSIN DM) 100-10 MG/5ML syrup Take 15 mLs by mouth every 4 (four) hours as needed for cough.   Yes [provider]  lisinopril (PRINIVIL,ZESTRIL) 40 MG tablet Take 1 tablet by mouth daily. 11/27/14  Yes [provider]  methimazole (TAPAZOLE) 5 MG tablet Take 5 mg by mouth daily.   Yes [provider]  Multiple Vitamins-Minerals (OCUVITE PRESERVISION PO) Take 1 tablet by mouth daily.   Yes [provider]    oxyCODONE (OXY IR/ROXICODONE) 5 MG immediate release tablet Take 5 mg by mouth every 6 (six) hours as needed for severe pain.   Yes [provider]  phenol (CHLORASEPTIC MOUTH PAIN) 1.4 % LIQD Use as directed 1 spray in the mouth or throat as needed for throat irritation / pain.   Yes [provider]  Propylene Glycol (SYSTANE BALANCE) 0.6 % SOLN Place 1 drop into both eyes 2 (two) times daily.   Yes [provider]  sertraline (ZOLOFT) 25 MG tablet Take 25 mg by mouth every evening. 01/23/17  Yes [provider]  simvastatin (ZOCOR) 20 MG tablet Take 20 mg by mouth every evening.   Yes [provider]  vitamin B-12 (CYANOCOBALAMIN) 1000 MCG tablet Take 1,000 mcg by mouth daily.   Yes [provider]  vitamin E 200 UNIT capsule Take 600 Units by mouth daily.   Yes [provider]  senna (SENOKOT) 8.6 MG TABS tablet Take 2 tablets (17.2 mg total) by mouth daily. Patient not taking: Reported on 02/09/2017 02/21/15   Elease Etienne, MD    Family History Family History  Problem Relation Age of Onset  . Diabetes Maternal Aunt   . Diabetes Maternal Uncle   . Cancer Maternal Grandmother     Social History Social History  Substance Use Topics  . Smoking status: Former Smoker    Packs/day: 1.00    Years: 66.00    Types: Cigarettes    Quit date: 06/21/2011  . Smokeless tobacco: Never Used  . Alcohol use No     Allergies   Patient has no known allergies.   Review of Systems Review of Systems  Constitutional: Negative for fever.  Respiratory: Negative for shortness of breath.   Cardiovascular: Negative for chest pain.  Gastrointestinal: Negative for abdominal pain.  Genitourinary: Negative for dysuria.  Musculoskeletal: Negative for back pain.  Neurological: Positive for headaches.  Psychiatric/Behavioral: Negative for confusion.  All other systems reviewed and are negative.    Physical Exam Updated Vital  Signs BP (!) 154/76 (BP Location: Right Arm)   Pulse 89   Temp 97.7 F (36.5 C) (Oral)   Resp 20   Ht 1.499 m (4\' 11" )   SpO2 100%   Physical Exam  Constitutional: No distress.  Elderly, frail  HENT:  Head: Normocephalic.  Right Ear: External ear normal.  Left Ear: External ear normal.  Large hematoma right forehead, , no facial bone tenderness, or asymmetry  Eyes: Conjunctivae are normal. Right eye exhibits no discharge. Left eye exhibits no discharge. No scleral icterus.  Neck: Neck supple. No tracheal deviation present.  Cardiovascular: Normal rate, regular rhythm and intact distal pulses.   Pulmonary/Chest: Effort normal and breath sounds normal. No stridor. No respiratory distress. She has no wheezes. She has no rales.  Abdominal: Soft. Bowel sounds are normal. She exhibits no distension. There is no tenderness. There is no rebound  and no guarding.  Musculoskeletal: She exhibits no edema.       Right shoulder: She exhibits no tenderness, no bony tenderness and no swelling.       Left shoulder: She exhibits no tenderness, no bony tenderness and no swelling.       Right wrist: She exhibits no tenderness, no bony tenderness and no swelling.       Left wrist: She exhibits no tenderness, no bony tenderness and no swelling.       Right hip: She exhibits normal range of motion, no tenderness, no bony tenderness and no swelling.       Left hip: She exhibits normal range of motion, no tenderness and no bony tenderness.       Right knee: Tenderness (bruising noted over patella) found.       Left knee: Tenderness found.       Right ankle: She exhibits no swelling. No tenderness.       Left ankle: She exhibits no swelling. No tenderness.       Cervical back: She exhibits no tenderness, no bony tenderness and no swelling.       Thoracic back: She exhibits no tenderness, no bony tenderness and no swelling.       Lumbar back: She exhibits no tenderness, no bony tenderness and no swelling.   Neurological: She is alert. She has normal strength. No cranial nerve deficit (no facial droop, extraocular movements intact, no slurred speech) or sensory deficit. She exhibits normal muscle tone. She displays no seizure activity. Coordination normal.  Skin: Skin is warm and dry. No rash noted.  Psychiatric: She has a normal mood and affect.  Nursing note and vitals reviewed.    ED Treatments / Results  Labs (all labs ordered are listed, but only abnormal results are displayed) Labs Reviewed  CBC WITH DIFFERENTIAL/PLATELET  BASIC METABOLIC PANEL    EKG  EKG Interpretation None       Radiology Ct Head Wo Contrast  Result Date: 02/09/2017 CLINICAL DATA:  SLIP AND FALL WITH RIGHT FOREHEAD HEMATOMA NO LOC PER PATIENT NECK PAIN AND BACK PAIN PMH: HYPERLIPIDEMIA, HYPERTHYROIDISM, ARTHRITIS, DEPRESSION, CEREBROVASCULAR DISEASE, MACULAR DEGENERATION, PVD EXAM: CT HEAD WITHOUT CONTRAST CT CERVICAL SPINE WITHOUT CONTRAST TECHNIQUE: Multidetector CT imaging of the head and cervical spine was performed following the standard protocol without intravenous contrast. Multiplanar CT image reconstructions of the cervical spine were also generated. COMPARISON:  Head CT 02/08/2015. FINDINGS: CT HEAD FINDINGS Brain: No intracranial hemorrhage. No parenchymal contusion. No midline shift or mass effect. Basilar cisterns are patent. No skull base fracture. No fluid in the paranasal sinuses or mastoid air cells. Orbits are normal. There are periventricular and subcortical white matter hypodensities. Generalized cortical atrophy. Vascular: No hyperdense vessel or unexpected calcification. Skull: Normal. Negative for fracture or focal lesion. Sinuses/Orbits: Paranasal sinuses and mastoid air cells are clear. Orbits are clear. Other: Large RIGHT frontal scalp hematoma measuring 17 mm in depth. No associated skull fracture no fluid in the paranasal sinuses. Orbits are normal. No proptosis. CT CERVICAL SPINE FINDINGS  Motion degradation Alignment: Normal alignment of the cervical vertebral bodies. Skull base and vertebrae: Normal craniocervical junction. No loss of bowel vertebral body height or disc height. Normal facet articulation. No evidence of fracture. Soft tissues and spinal canal: No prevertebral soft tissue swelling. No perispinal or epidural hematoma. Disc levels: Multiple levels of disc space narrowing and endplate spurring. Upper chest: Clear Other: None IMPRESSION: 1. Large right frontal scalp hematoma.  No  skull fracture. 2. No intracranial trauma. 3. Extensive atrophy and white matter microvascular disease. 4. No cervical spine fracture.  Motion degradation noted. 5. Multilevel disc osteophytic disease Electronically Signed   By: Genevive BiStewart  Edmunds M.D.   On: 02/09/2017 09:00   Ct Cervical Spine Wo Contrast  Result Date: 02/09/2017 CLINICAL DATA:  SLIP AND FALL WITH RIGHT FOREHEAD HEMATOMA NO LOC PER PATIENT NECK PAIN AND BACK PAIN PMH: HYPERLIPIDEMIA, HYPERTHYROIDISM, ARTHRITIS, DEPRESSION, CEREBROVASCULAR DISEASE, MACULAR DEGENERATION, PVD EXAM: CT HEAD WITHOUT CONTRAST CT CERVICAL SPINE WITHOUT CONTRAST TECHNIQUE: Multidetector CT imaging of the head and cervical spine was performed following the standard protocol without intravenous contrast. Multiplanar CT image reconstructions of the cervical spine were also generated. COMPARISON:  Head CT 02/08/2015. FINDINGS: CT HEAD FINDINGS Brain: No intracranial hemorrhage. No parenchymal contusion. No midline shift or mass effect. Basilar cisterns are patent. No skull base fracture. No fluid in the paranasal sinuses or mastoid air cells. Orbits are normal. There are periventricular and subcortical white matter hypodensities. Generalized cortical atrophy. Vascular: No hyperdense vessel or unexpected calcification. Skull: Normal. Negative for fracture or focal lesion. Sinuses/Orbits: Paranasal sinuses and mastoid air cells are clear. Orbits are clear. Other: Large  RIGHT frontal scalp hematoma measuring 17 mm in depth. No associated skull fracture no fluid in the paranasal sinuses. Orbits are normal. No proptosis. CT CERVICAL SPINE FINDINGS Motion degradation Alignment: Normal alignment of the cervical vertebral bodies. Skull base and vertebrae: Normal craniocervical junction. No loss of bowel vertebral body height or disc height. Normal facet articulation. No evidence of fracture. Soft tissues and spinal canal: No prevertebral soft tissue swelling. No perispinal or epidural hematoma. Disc levels: Multiple levels of disc space narrowing and endplate spurring. Upper chest: Clear Other: None IMPRESSION: 1. Large right frontal scalp hematoma.  No skull fracture. 2. No intracranial trauma. 3. Extensive atrophy and white matter microvascular disease. 4. No cervical spine fracture.  Motion degradation noted. 5. Multilevel disc osteophytic disease Electronically Signed   By: Genevive BiStewart  Edmunds M.D.   On: 02/09/2017 09:00   Dg Knee Complete 4 Views Left  Result Date: 02/09/2017 CLINICAL DATA:  Unwitnessed fall, knee pain, initial encounter EXAM: LEFT KNEE - COMPLETE 4+ VIEW COMPARISON:  None. FINDINGS: No evidence of fracture, dislocation, or joint effusion. Mild medial joint space narrowing is noted. Meniscal calcifications are seen. Diffuse vascular calcifications are noted. IMPRESSION: No acute abnormality seen. Electronically Signed   By: Alcide CleverMark  Lukens M.D.   On: 02/09/2017 08:14   Dg Knee Complete 4 Views Right  Result Date: 02/09/2017 CLINICAL DATA:  Unwitnessed fall, knee pain, initial encounter EXAM: RIGHT KNEE - COMPLETE 4+ VIEW COMPARISON:  None. FINDINGS: No acute fracture or dislocation is noted. Very mild medial joint space narrowing is seen. Diffuse vascular calcifications are noted. Meniscal calcifications are seen as well. IMPRESSION: No acute abnormality noted. Electronically Signed   By: Alcide CleverMark  Lukens M.D.   On: 02/09/2017 08:15    Procedures Procedures  (including critical care time)  Medications Ordered in ED Medications  ibuprofen (ADVIL,MOTRIN) tablet 200 mg (200 mg Oral Given 02/09/17 [id number])     Initial Impression / Assessment and Plan / ED Course  I have reviewed the triage vital signs and the nursing notes.  Pertinent labs & imaging results that were available during my care of the patient were reviewed by me and considered in my medical decision making (see chart for details).  Clinical Course as of Feb 10 936  Tue Feb 09, 2017  [id number]  I discussed the results with the patient.  She has a large hematoma but otherwise no serious injury.  I explained to the patient that I wanted to check her blood test to make sure nothing serious is going on.  Pt states she just slipped and does not want any blood tests.  She wants to go home.  Pt otherwise appears stable.  Will hold off on blood testing as she requests.  [JK]  [id number] Paced rhythm noted on the monitor.  [JK]    Clinical Course User Index [JK] Linwood Dibbles, MD    Patient presented to the emergency room after an unwitnessed fall. Patient states she slipped. She denies loss of consciousness, chest pain, shortness of breath, numbness or weakness. X-rays do not show any serious injuries.    Will dc back to nursing facility.  Apply ice to help with the swelling.  Final Clinical Impressions(s) / ED Diagnoses   Final diagnoses:  Traumatic hematoma of forehead, initial encounter    New Prescriptions New Prescriptions   No medications on file     Linwood Dibbles, MD 02/09/17 [phone]

## 2017-02-09 NOTE — ED Notes (Addendum)
Attempted to update vital signs and pt refused

## 2017-02-09 NOTE — ED Notes (Signed)
Patient refusing ice pack at this time. Pt states, "I already have had one on my head twice this morning".

## 2017-02-09 NOTE — ED Notes (Signed)
Pt refused blood draw

## 2017-02-09 NOTE — ED Notes (Signed)
Called [facility] to attempt to give discharge instructions, but after being on hold over 6 minutes. I hung up.

## 2017-02-09 NOTE — Discharge Instructions (Signed)
The CT scan showed a large forehead hematoma but no fractures or serious injuries.  Apply ice to help with the swelling.  The bruising will take several weeks to resolve.  Take over the counter medications as needed for pain.

## 2017-02-09 NOTE — ED Notes (Signed)
[facility]

## 2017-02-09 NOTE — ED Triage Notes (Addendum)
Per [facility] patient from Kathryn [name] for unwitnessed fall. Patient has hematoma to forehead.  Patient had no LOC nor taking blood thinners..  Patient reports that she slipped causing her to fall.

## 2018-03-12 IMAGING — CR DG KNEE COMPLETE 4+V*R*
4 series · 4 of 4 positions shown · non-contrast
Comparison: None.

CLINICAL DATA: Unwitnessed fall, knee pain, initial encounter

EXAM:
RIGHT KNEE - COMPLETE 4+ VIEW

[x knee ap right (1 of 3)]
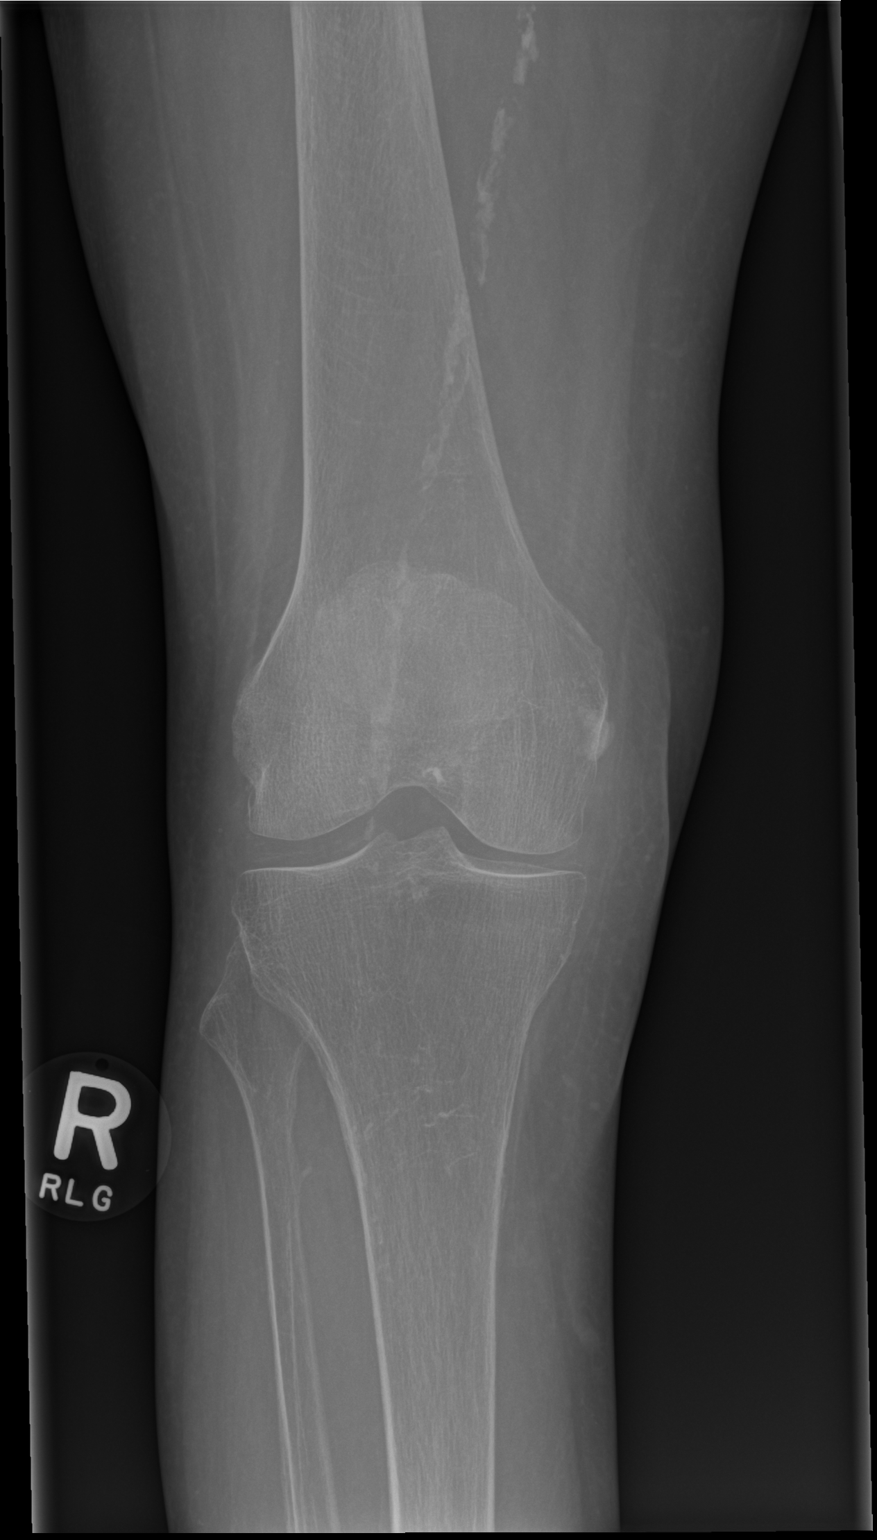

[x knee ap right (2 of 3)]
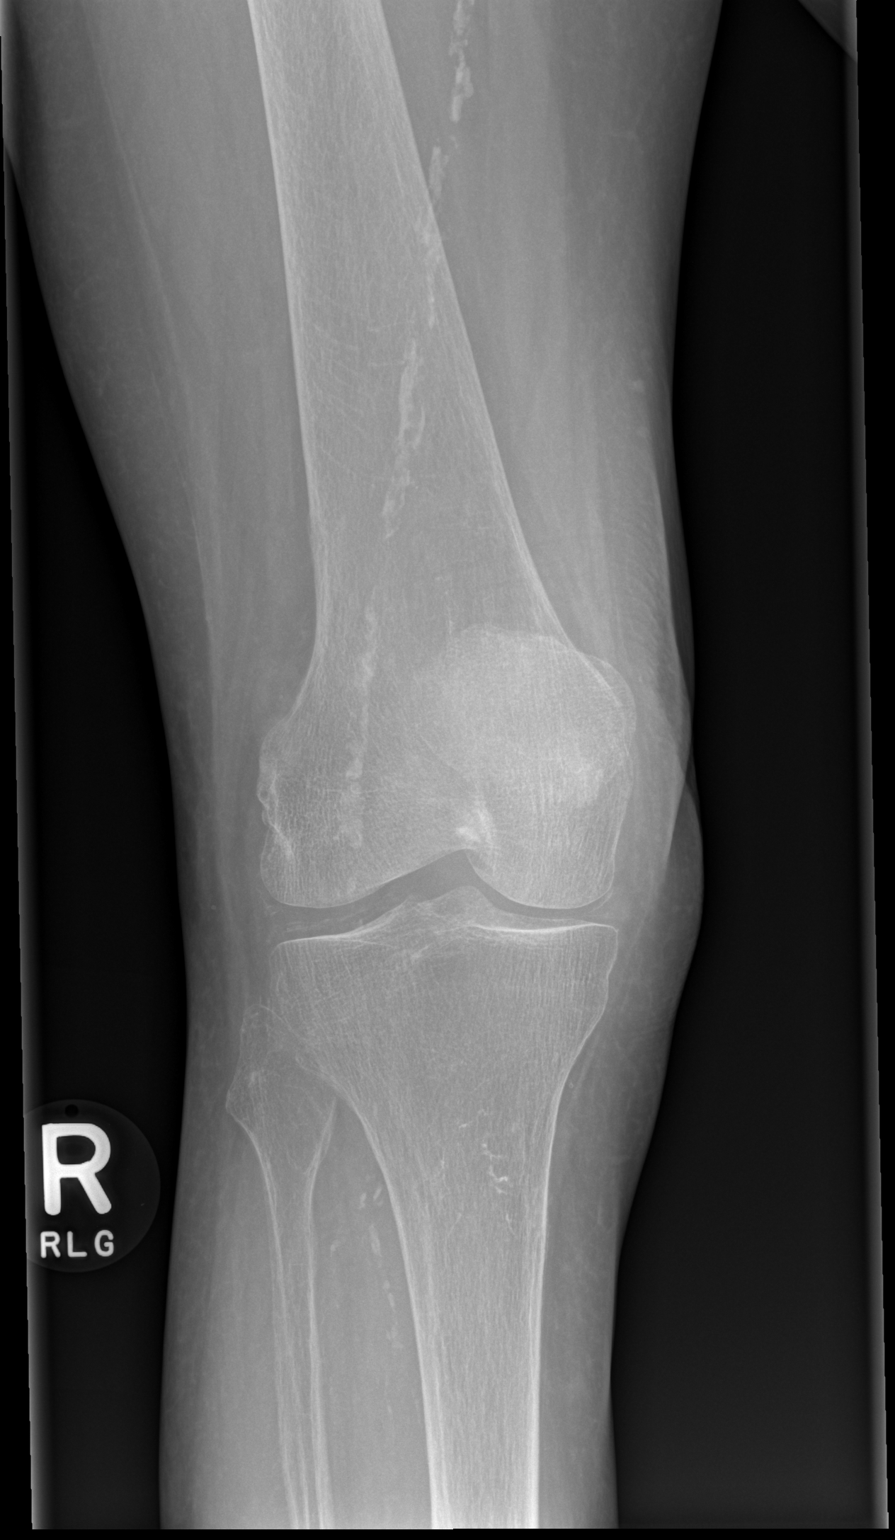

[x knee ap right (3 of 3)]
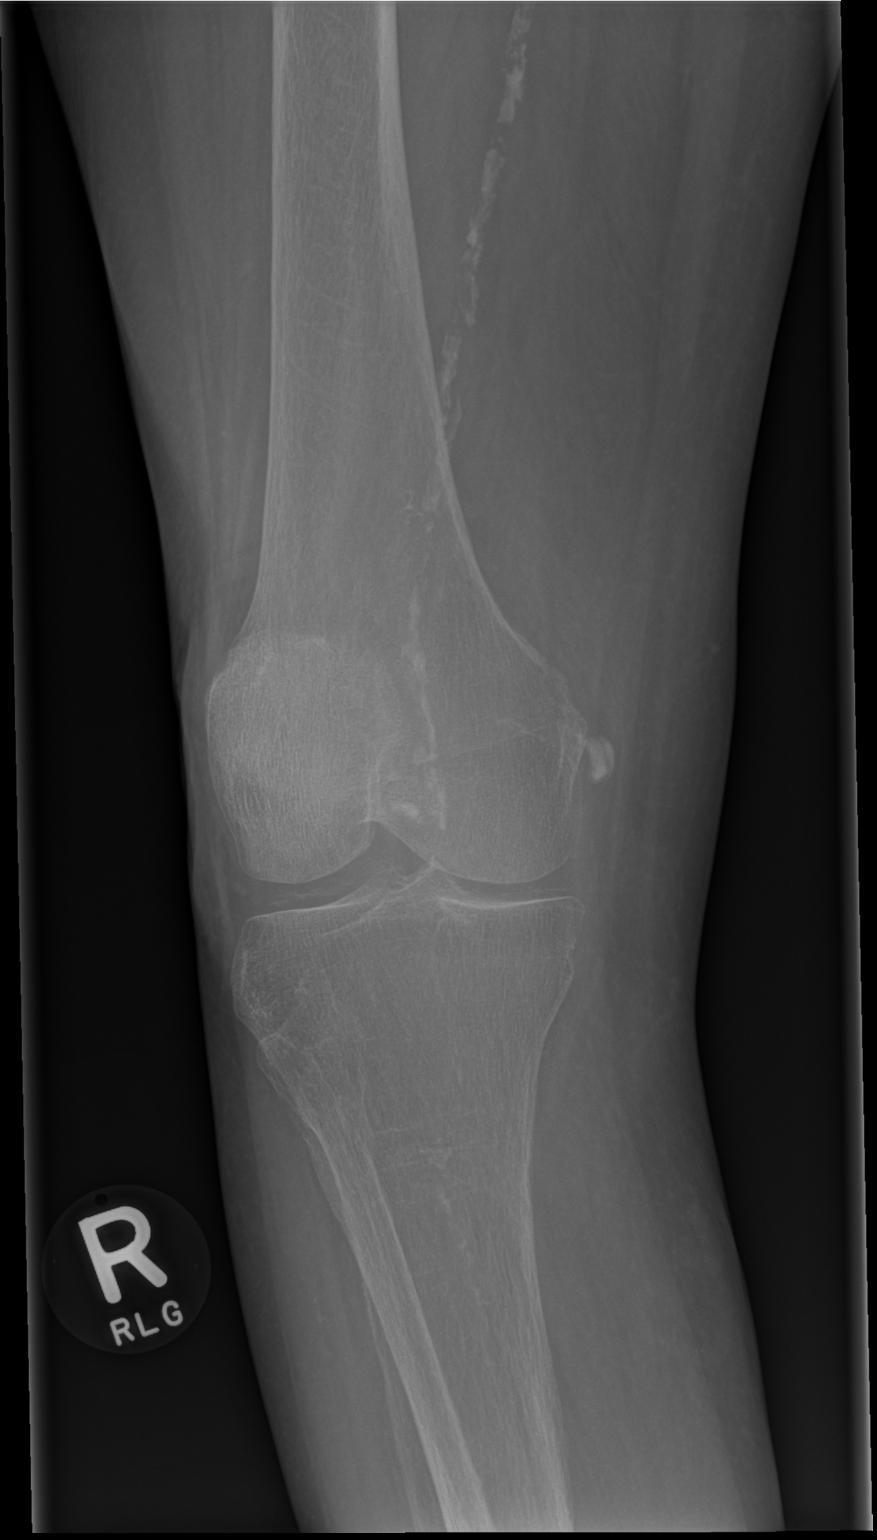

[x knee lat right]
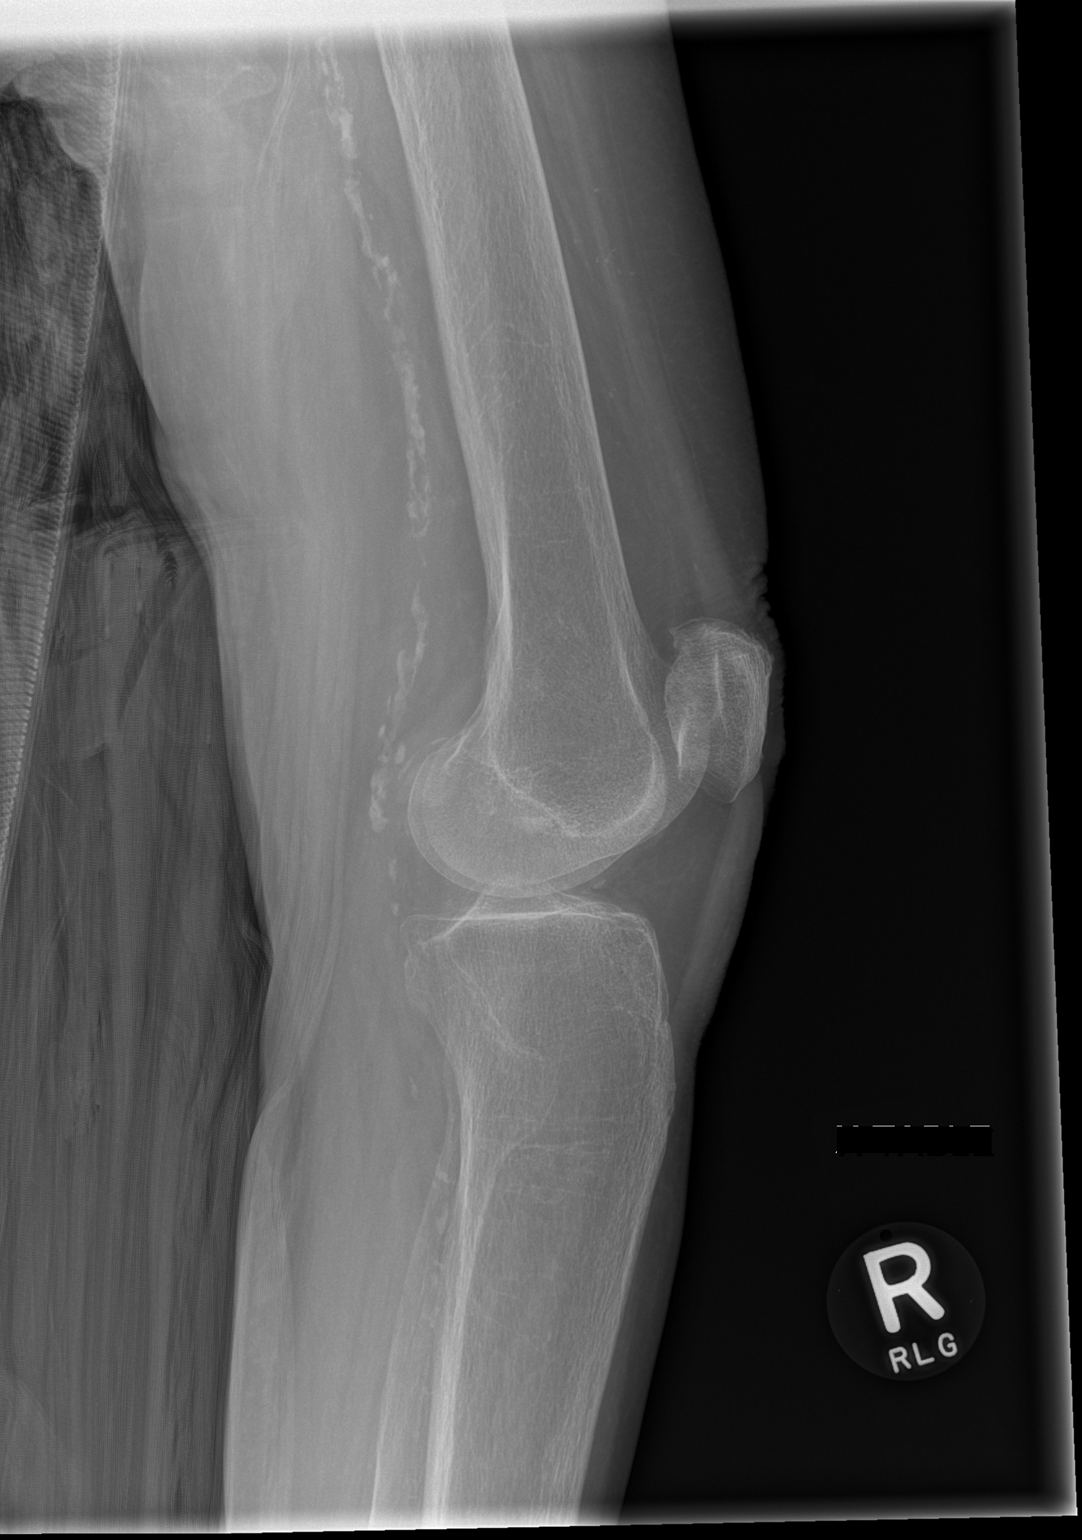

[4 of 4 positions shown; findings below may reference images not displayed]

FINDINGS: No acute fracture or dislocation is noted. Very mild medial joint
space narrowing is seen. Diffuse vascular calcifications are noted.
Meniscal calcifications are seen as well.
IMPRESSION: No acute abnormality noted.

## 2019-01-28 DEATH — deceased
